# Patient Record
Sex: Male | Born: 1970 | Race: White | Hispanic: No | Marital: Single | Smoking: Former smoker
Health system: Southern US, Community
[De-identification: ages and names within clinical notes are randomized; demographics above are authoritative.]

## PROBLEM LIST (undated history)

## (undated) DIAGNOSIS — I2510 Atherosclerotic heart disease of native coronary artery without angina pectoris: Secondary | ICD-10-CM

## (undated) DIAGNOSIS — E118 Type 2 diabetes mellitus with unspecified complications: Secondary | ICD-10-CM

## (undated) DIAGNOSIS — I10 Essential (primary) hypertension: Secondary | ICD-10-CM

## (undated) DIAGNOSIS — I219 Acute myocardial infarction, unspecified: Secondary | ICD-10-CM

## (undated) DIAGNOSIS — Z9109 Other allergy status, other than to drugs and biological substances: Secondary | ICD-10-CM

## (undated) DIAGNOSIS — R7303 Prediabetes: Secondary | ICD-10-CM

## (undated) DIAGNOSIS — E785 Hyperlipidemia, unspecified: Secondary | ICD-10-CM

## (undated) HISTORY — DX: Essential (primary) hypertension: I10

## (undated) HISTORY — DX: Atherosclerotic heart disease of native coronary artery without angina pectoris: I25.10

## (undated) HISTORY — PX: KNEE ARTHROSCOPY W/ MENISCECTOMY: SHX1879

## (undated) HISTORY — DX: Other allergy status, other than to drugs and biological substances: Z91.09

## (undated) HISTORY — DX: Acute myocardial infarction, unspecified: I21.9

## (undated) HISTORY — DX: Prediabetes: R73.03

## (undated) HISTORY — DX: Type 2 diabetes mellitus with unspecified complications: E11.8

## (undated) HISTORY — DX: Hyperlipidemia, unspecified: E78.5

---

## 2000-06-21 ENCOUNTER — Emergency Department (HOSPITAL_COMMUNITY): Admission: EM | Admit: 2000-06-21 | Discharge: 2000-06-22 | Payer: Self-pay | Admitting: Emergency Medicine

## 2000-06-22 ENCOUNTER — Encounter: Payer: Self-pay | Admitting: Emergency Medicine

## 2008-06-24 ENCOUNTER — Emergency Department (HOSPITAL_COMMUNITY): Admission: EM | Admit: 2008-06-24 | Discharge: 2008-06-24 | Payer: Self-pay | Admitting: Emergency Medicine

## 2013-12-01 DIAGNOSIS — I214 Non-ST elevation (NSTEMI) myocardial infarction: Secondary | ICD-10-CM | POA: Insufficient documentation

## 2013-12-03 ENCOUNTER — Encounter (HOSPITAL_COMMUNITY): Payer: Self-pay | Admitting: Emergency Medicine

## 2013-12-03 ENCOUNTER — Inpatient Hospital Stay (HOSPITAL_COMMUNITY)
Admission: EM | Admit: 2013-12-03 | Discharge: 2013-12-07 | DRG: 247 | Disposition: A | Discharge status: Home / Self Care | Payer: 59 | Attending: Cardiology | Admitting: Cardiology

## 2013-12-03 ENCOUNTER — Emergency Department (HOSPITAL_COMMUNITY): Payer: 59

## 2013-12-03 DIAGNOSIS — Z6839 Body mass index (BMI) 39.0-39.9, adult: Secondary | ICD-10-CM

## 2013-12-03 DIAGNOSIS — E8881 Metabolic syndrome: Secondary | ICD-10-CM | POA: Diagnosis present

## 2013-12-03 DIAGNOSIS — E781 Pure hyperglyceridemia: Secondary | ICD-10-CM | POA: Diagnosis present

## 2013-12-03 DIAGNOSIS — F17200 Nicotine dependence, unspecified, uncomplicated: Secondary | ICD-10-CM | POA: Diagnosis present

## 2013-12-03 DIAGNOSIS — I2510 Atherosclerotic heart disease of native coronary artery without angina pectoris: Secondary | ICD-10-CM | POA: Diagnosis present

## 2013-12-03 DIAGNOSIS — I472 Ventricular tachycardia, unspecified: Secondary | ICD-10-CM | POA: Diagnosis present

## 2013-12-03 DIAGNOSIS — Z23 Encounter for immunization: Secondary | ICD-10-CM

## 2013-12-03 DIAGNOSIS — K219 Gastro-esophageal reflux disease without esophagitis: Secondary | ICD-10-CM | POA: Diagnosis present

## 2013-12-03 DIAGNOSIS — Z6841 Body Mass Index (BMI) 40.0 and over, adult: Secondary | ICD-10-CM

## 2013-12-03 DIAGNOSIS — I4729 Other ventricular tachycardia: Secondary | ICD-10-CM | POA: Diagnosis present

## 2013-12-03 DIAGNOSIS — I214 Non-ST elevation (NSTEMI) myocardial infarction: Principal | ICD-10-CM

## 2013-12-03 LAB — CBC WITH DIFFERENTIAL/PLATELET
Basophils Absolute: 0.1 10*3/uL (ref 0.0–0.1)
Basophils Relative: 0 % (ref 0–1)
Eosinophils Absolute: 0.2 10*3/uL (ref 0.0–0.7)
Eosinophils Relative: 1 % (ref 0–5)
HCT: 48.6 % (ref 39.0–52.0)
Hemoglobin: 16.2 g/dL (ref 13.0–17.0)
Lymphocytes Relative: 26 % (ref 12–46)
Lymphs Abs: 3.6 10*3/uL (ref 0.7–4.0)
MCH: 31.5 pg (ref 26.0–34.0)
MCHC: 33.3 g/dL (ref 30.0–36.0)
MCV: 94.6 fL (ref 78.0–100.0)
Monocytes Absolute: 0.6 10*3/uL (ref 0.1–1.0)
Monocytes Relative: 4 % (ref 3–12)
Neutro Abs: 9.3 10*3/uL — ABNORMAL HIGH (ref 1.7–7.7)
Neutrophils Relative %: 69 % (ref 43–77)
Platelets: 202 10*3/uL (ref 150–400)
RBC: 5.14 MIL/uL (ref 4.22–5.81)
RDW: 13.4 % (ref 11.5–15.5)
WBC: 13.8 10*3/uL — ABNORMAL HIGH (ref 4.0–10.5)

## 2013-12-03 LAB — COMPREHENSIVE METABOLIC PANEL
ALT: 15 U/L (ref 0–53)
AST: 17 U/L (ref 0–37)
Albumin: 3.7 g/dL (ref 3.5–5.2)
Alkaline Phosphatase: 76 U/L (ref 39–117)
Anion gap: 14 (ref 5–15)
BUN: 14 mg/dL (ref 6–23)
CO2: 24 mEq/L (ref 19–32)
Calcium: 8.7 mg/dL (ref 8.4–10.5)
Chloride: 104 mEq/L (ref 96–112)
Creatinine, Ser: 1.02 mg/dL (ref 0.50–1.35)
GFR calc Af Amer: >90 mL/min (ref >90)
GFR calc non Af Amer: 88 mL/min — ABNORMAL LOW (ref >90)
Glucose, Bld: 114 mg/dL — ABNORMAL HIGH (ref 70–99)
Potassium: 4.7 mEq/L (ref 3.7–5.3)
Sodium: 142 mEq/L (ref 137–147)
Total Bilirubin: 0.2 mg/dL — ABNORMAL LOW (ref 0.3–1.2)
Total Protein: 6.1 g/dL (ref 6.0–8.3)

## 2013-12-03 LAB — TROPONIN I: Troponin I: 0.36 ng/mL (ref <0.30)

## 2013-12-03 LAB — LIPASE, BLOOD: LIPASE: 28 U/L (ref 11–59)

## 2013-12-03 MED ORDER — SODIUM CHLORIDE 0.9 % IV BOLUS (SEPSIS)
1000.0000 mL | Freq: Once | INTRAVENOUS | Status: AC
  Administered 2013-12-03: 1000 mL via INTRAVENOUS

## 2013-12-03 NOTE — ED Notes (Signed)
The pt has been out in the heat most of the day staining a deck.  For 45 minutes he has been having some mid-chest pain no son no nausea

## 2013-12-03 NOTE — ED Provider Notes (Signed)
CSN: [id number]     Arrival date & time 12/03/13  1821 History   First MD Initiated Contact with Patient 12/03/13 2057     Chief Complaint  Patient presents with  . Chest Pain     (Consider location/radiation/quality/duration/timing/severity/associated sxs/prior Treatment) HPI Patient presents to the emergency department with mid sternal chest pressure that started today around 4:30 and lasted about an hour and a half.  The patient, states, that he had 5 minutes of intense pressure in the pressure would go down in intensity within it back up in intensity and lasted for about 5 minutes.  This pattern repeated itself until his pain resolved.  Patient, states, that he did not have any shortness of breath, nausea, vomiting, diaphoresis, weakness, dizziness, headache, blurred vision, back pain, neck pain, fever, cough, rash or syncope.  The patient, states, that he took an aspirin prior to arrival.  Patient, states, that nothing seems make his condition, better or worse.  The patient did say that his head.  Some mild bouts of chest pressure over the last 2 weeks History reviewed. No pertinent past medical history. History reviewed. No pertinent past surgical history. No family history on file. History  Substance Use Topics  . Smoking status: Current Every Day Smoker  . Smokeless tobacco: Not on file  . Alcohol Use: Yes    Review of Systems  All other systems negative except as documented in the HPI. All pertinent positives and negatives as reviewed in the HPI.  Allergies  Review of patient's allergies indicates no known allergies.  Home Medications   Prior to Admission medications   Medication Sig Start Date End Date Taking? Authorizing Provider  zolpidem (AMBIEN) 10 MG tablet Take 10 mg by mouth at bedtime as needed for sleep.   Yes Historical Provider, MD   BP 130/78  Pulse 67  Temp(Src) 97.8 F (36.6 C) (Oral)  Resp 16  SpO2 97% Physical Exam  Nursing note and vitals  reviewed. Constitutional: He is oriented to person, place, and time. He appears well-developed and well-nourished. No distress.  HENT:  Head: Normocephalic and atraumatic.  Mouth/Throat: Oropharynx is clear and moist.  Eyes: Pupils are equal, round, and reactive to light.  Neck: Normal range of motion. Neck supple.  Cardiovascular: Normal rate, regular rhythm and normal heart sounds.  Exam reveals no gallop and no friction rub.   No murmur heard. Pulmonary/Chest: Effort normal and breath sounds normal. No respiratory distress.  Neurological: He is alert and oriented to person, place, and time. He exhibits normal muscle tone. Coordination normal.  Skin: Skin is warm and dry. No erythema.    ED Course  Procedures (including critical care time) Labs Review Labs Reviewed  CBC WITH DIFFERENTIAL - Abnormal; Notable for the following:    WBC 13.8 (*)    Neutro Abs 9.3 (*)    All other components within normal limits  COMPREHENSIVE METABOLIC PANEL - Abnormal; Notable for the following:    Glucose, Bld 114 (*)    Total Bilirubin 0.2 (*)    GFR calc non Af Amer 88 (*)    All other components within normal limits  LIPASE, BLOOD  TROPONIN I  URINALYSIS, ROUTINE W REFLEX MICROSCOPIC  TROPONIN I    Imaging Review Dg Chest 2 View  12/03/2013   CLINICAL DATA:  Mid chest pain, intermittent for the past several days, worse today. Current smoker.  EXAM: CHEST  2 VIEW  COMPARISON:  None.  FINDINGS: Cardiomediastinal silhouette unremarkable. Lungs clear.  Bronchovascular markings normal. Pulmonary vascularity normal. No visible pleural effusions. No pneumothorax. Mild degenerative changes involving the thoracic spine.  IMPRESSION: No acute cardiopulmonary disease.   Electronically Signed   By: Hulan Saas M.D.   On: 12/03/2013 19:10      Date: 12/03/2013  Rate: 77  Rhythm: normal sinus rhythm  QRS Axis: normal  Intervals: normal  ST/T Wave abnormalities: normal  Conduction  Disutrbances:none  Narrative Interpretation:   Old EKG Reviewed: none available   Date: 12/03/2013 23:43  Rate: 69  Rhythm: normal sinus rhythm  QRS Axis: normal  Intervals: normal  ST/T Wave abnormalities: normal  Conduction Disutrbances:none  Narrative Interpretation:   Old EKG Reviewed: unchanged  Advised the patient, that he'll need admission to the hospital.  He is very reluctant to be admitted.  I explained to him the significant seriousness of this condition.  He agrees to be admitted.  I spoke with cardiology Dr. Nadara Eaton, who will admit the patient to the hospital for further evaluation.  Patient is currently pain-free senna nitroglycerin started.  We did start heparin   CRITICAL CARE Performed by: Carlyle Dolly Total critical care time: 35 mins Critical care time was exclusive of separately billable procedures and treating other patients. Critical care was necessary to treat or prevent imminent or life-threatening deterioration. Critical care was time spent personally by me on the following activities: development of treatment plan with patient and/or surrogate as well as nursing, discussions with consultants, evaluation of patient's response to treatment, examination of patient, obtaining history from patient or surrogate, ordering and performing treatments and interventions, ordering and review of laboratory studies, ordering and review of radiographic studies, pulse oximetry and re-evaluation of patient's condition.     Carlyle Dolly, PA-C 12/04/13 [phone]

## 2013-12-03 NOTE — ED Notes (Signed)
Pt instructed to use call bell if worsening chest pain or needs.

## 2013-12-03 NOTE — ED Notes (Signed)
Pt unable to void 

## 2013-12-03 NOTE — ED Notes (Signed)
Pt reports he still cannot provide sample. Thinks he is dehydrated. MD aware.

## 2013-12-04 ENCOUNTER — Encounter (HOSPITAL_COMMUNITY): Payer: Self-pay | Admitting: Cardiology

## 2013-12-04 DIAGNOSIS — I214 Non-ST elevation (NSTEMI) myocardial infarction: Secondary | ICD-10-CM | POA: Diagnosis present

## 2013-12-04 LAB — PROTIME-INR
INR: 1.04 (ref 0.00–1.49)
PROTHROMBIN TIME: 13.6 s (ref 11.6–15.2)

## 2013-12-04 LAB — URINALYSIS, ROUTINE W REFLEX MICROSCOPIC
BILIRUBIN URINE: NEGATIVE
Glucose, UA: NEGATIVE mg/dL
HGB URINE DIPSTICK: NEGATIVE
Ketones, ur: NEGATIVE mg/dL
Leukocytes, UA: NEGATIVE
NITRITE: NEGATIVE
PROTEIN: NEGATIVE mg/dL
SPECIFIC GRAVITY, URINE: 1.029 (ref 1.005–1.030)
UROBILINOGEN UA: 0.2 mg/dL (ref 0.0–1.0)
pH: 6 (ref 5.0–8.0)

## 2013-12-04 LAB — LIPID PANEL
Cholesterol: 138 mg/dL (ref 0–200)
HDL: 31 mg/dL — ABNORMAL LOW (ref >39)
LDL Cholesterol: 71 mg/dL (ref 0–99)
Total CHOL/HDL Ratio: 4.5 RATIO
Triglycerides: 180 mg/dL — ABNORMAL HIGH (ref <150)
VLDL: 36 mg/dL (ref 0–40)

## 2013-12-04 LAB — CBC
HEMATOCRIT: 41.5 % (ref 39.0–52.0)
Hemoglobin: 13.7 g/dL (ref 13.0–17.0)
MCH: 30.9 pg (ref 26.0–34.0)
MCHC: 33 g/dL (ref 30.0–36.0)
MCV: 93.5 fL (ref 78.0–100.0)
Platelets: 194 10*3/uL (ref 150–400)
RBC: 4.44 MIL/uL (ref 4.22–5.81)
RDW: 13.7 % (ref 11.5–15.5)
WBC: 13.9 10*3/uL — ABNORMAL HIGH (ref 4.0–10.5)

## 2013-12-04 LAB — HEPARIN LEVEL (UNFRACTIONATED)
HEPARIN UNFRACTIONATED: 0.44 [IU]/mL (ref 0.30–0.70)
Heparin Unfractionated: 0.32 IU/mL (ref 0.30–0.70)
Heparin Unfractionated: 0.19 IU/mL — ABNORMAL LOW (ref 0.30–0.70)

## 2013-12-04 LAB — TROPONIN I
Troponin I: 0.91 ng/mL (ref <0.30)
Troponin I: 2.45 ng/mL (ref <0.30)
Troponin I: 1.65 ng/mL (ref <0.30)
Troponin I: 1.36 ng/mL (ref <0.30)

## 2013-12-04 LAB — HEMOGLOBIN A1C
Hgb A1c MFr Bld: 5.7 % — ABNORMAL HIGH (ref <5.7)
Mean Plasma Glucose: 117 mg/dL — ABNORMAL HIGH (ref <117)

## 2013-12-04 MED ORDER — HEPARIN (PORCINE) IN NACL 100-0.45 UNIT/ML-% IJ SOLN
1650.0000 [IU]/h | INTRAMUSCULAR | Status: DC
  Administered 2013-12-04: 1400 [IU]/h via INTRAVENOUS
  Administered 2013-12-04 – 2013-12-05 (×2): 1700 [IU]/h via INTRAVENOUS
  Administered 2013-12-05: 1650 [IU]/h via INTRAVENOUS
  Filled 2013-12-04 (×6): qty 250

## 2013-12-04 MED ORDER — METOPROLOL TARTRATE 25 MG PO TABS
25.0000 mg | ORAL_TABLET | Freq: Two times a day (BID) | ORAL | Status: DC
  Administered 2013-12-04 – 2013-12-07 (×7): 25 mg via ORAL
  Filled 2013-12-04 (×9): qty 1

## 2013-12-04 MED ORDER — NITROGLYCERIN 2 % TD OINT
1.0000 [in_us] | TOPICAL_OINTMENT | Freq: Four times a day (QID) | TRANSDERMAL | Status: DC
  Administered 2013-12-04 – 2013-12-06 (×10): 1 [in_us] via TOPICAL
  Filled 2013-12-04: qty 30

## 2013-12-04 MED ORDER — PNEUMOCOCCAL VAC POLYVALENT 25 MCG/0.5ML IJ INJ
0.5000 mL | INJECTION | INTRAMUSCULAR | Status: DC
  Filled 2013-12-04: qty 0.5

## 2013-12-04 MED ORDER — HEPARIN BOLUS VIA INFUSION
3000.0000 [IU] | Freq: Once | INTRAVENOUS | Status: AC
  Administered 2013-12-04: 3000 [IU] via INTRAVENOUS
  Filled 2013-12-04: qty 3000

## 2013-12-04 MED ORDER — HEPARIN BOLUS VIA INFUSION
4000.0000 [IU] | Freq: Once | INTRAVENOUS | Status: AC
  Administered 2013-12-04: 4000 [IU] via INTRAVENOUS
  Filled 2013-12-04: qty 4000

## 2013-12-04 MED ORDER — ATORVASTATIN CALCIUM 80 MG PO TABS
80.0000 mg | ORAL_TABLET | Freq: Every day | ORAL | Status: DC
  Administered 2013-12-04 – 2013-12-06 (×3): 80 mg via ORAL
  Filled 2013-12-04 (×4): qty 1

## 2013-12-04 MED ORDER — ONDANSETRON HCL 4 MG/2ML IJ SOLN: 4.0000 mg | Freq: Three times a day (TID) | INTRAMUSCULAR | Status: AC | PRN

## 2013-12-04 MED ORDER — ASPIRIN 81 MG PO CHEW
324.0000 mg | CHEWABLE_TABLET | Freq: Once | ORAL | Status: AC
  Administered 2013-12-04: 324 mg via ORAL
  Filled 2013-12-04: qty 4

## 2013-12-04 MED ORDER — ASPIRIN 81 MG PO CHEW
81.0000 mg | CHEWABLE_TABLET | Freq: Every day | ORAL | Status: DC
  Administered 2013-12-04 – 2013-12-07 (×4): 81 mg via ORAL
  Filled 2013-12-04 (×4): qty 1

## 2013-12-04 MED ORDER — SODIUM CHLORIDE 0.9 % IJ SOLN
3.0000 mL | Freq: Two times a day (BID) | INTRAMUSCULAR | Status: DC
  Administered 2013-12-05: 3 mL via INTRAVENOUS

## 2013-12-04 MED ORDER — SODIUM CHLORIDE 0.9 % IV SOLN
INTRAVENOUS | Status: DC
  Administered 2013-12-06: 04:00:00 via INTRAVENOUS

## 2013-12-04 MED ORDER — SODIUM CHLORIDE 0.9 % IV SOLN: 250.0000 mL | INTRAVENOUS | Status: DC | PRN

## 2013-12-04 MED ORDER — SODIUM CHLORIDE 0.9 % IJ SOLN
3.0000 mL | INTRAMUSCULAR | Status: DC | PRN
  Administered 2013-12-05: 3 mL via INTRAVENOUS

## 2013-12-04 NOTE — ED Notes (Signed)
CRITICAL VALUE ALERT  Date of notification:  12/04/13  Time of notification:  0005  Critical value read back:Yes.    Nurse who received alert:  Armen Pickup RN  PA-C notified:  Ebbie Ridge PA-C

## 2013-12-04 NOTE — ED Notes (Signed)
Pt still unable to void

## 2013-12-04 NOTE — H&P (Signed)
Manuel Carey is an 43 y.o. male.   Chief Complaint: Chest pain HPI: A shunt is a 19 Caucasian male with no significant prior cardiovascular history, history of tobacco use disorder, morbid obesity who presented with chest pain that started about 1 week to 2 weeks ago. It was exertional in the beginning, he would belch and would feel better. It is located in the middle of the chest, lasting few minutes. He thought that this is related to GERD. Yesterday he worked on his deck and did heavy exertional activity, this was followed by severe chest tightness, associated with diaphoresis, he would then presented to the emergency room. On presentation, he was found to have positive cardiac markers for myocardial injury. I was requested to admit the patient for further evaluation. Patient was chest pain-free on presentation to emergency room after he was started on IV heparin and was given sublingual nitroglycerin.  This morning patient remains asymptomatic. Except for chronic shortness of breath which he attributes to smoking, no the complaints. He sees Dr. Reece Levy is his PCP.  History reviewed. No pertinent past medical history.  History reviewed. No pertinent past surgical history.  No family history  coronary artery disease or diabetes mellitus.  Social History:  reports that he has been smoking Cigarettes.  He has a 25 pack-year smoking history. He does not have any smokeless tobacco history on file. He reports that he drinks alcohol. His drug history is not on file.  Allergies: No Known Allergies  Medications Prior to Admission  Medication Sig Dispense Refill  . zolpidem (AMBIEN) 10 MG tablet Take 10 mg by mouth at bedtime as needed for sleep.          Review of Systems - has GERD, loud snoring at night, denies symptoms of sleep apnea, no symptoms of TIA or claudication, no recent weight changes, no palpation. No heat or cold intolerance. Other systems negative.   Blood pressure  109/67, pulse 66, temperature 98.5 F (36.9 C), temperature source Oral, resp. rate 16, height '5\' 9"'  (1.753 m), weight 122.925 kg (271 lb), SpO2 97.00%. General appearance: alert, cooperative, appears stated age and morbidly obese Eyes: negative findings: conjunctivae and sclerae normal Neck: no adenopathy, no carotid bruit, no JVD, supple, symmetrical, trachea midline and thyroid not enlarged, symmetric, no tenderness/mass/nodules Neck: JVP - normal, carotids 2+= without bruits Resp: rales bilaterally and Diffuse Chest wall: no tenderness Cardio: regular rate and rhythm, S1, S2 normal, no murmur, click, rub or gallop GI: soft, non-tender; bowel sounds normal; no masses,  no organomegaly and Pannus present Extremities: extremities normal, atraumatic, no cyanosis or edema Pulses: 2+ and symmetric Skin: Skin color, texture, turgor normal. No rashes or lesions Neurologic: Grossly normal  Results for orders placed during the hospital encounter of 12/03/13 (from the past 48 hour(s))  CBC WITH DIFFERENTIAL     Status: Abnormal   Collection Time    12/03/13  6:36 PM      Result Value Ref Range   WBC 13.8 (*) 4.0 - 10.5 K/uL   RBC 5.14  4.22 - 5.81 MIL/uL   Hemoglobin 16.2  13.0 - 17.0 g/dL   HCT 48.6  39.0 - 52.0 %   MCV 94.6  78.0 - 100.0 fL   MCH 31.5  26.0 - 34.0 pg   MCHC 33.3  30.0 - 36.0 g/dL   RDW 13.4  11.5 - 15.5 %   Platelets 202  150 - 400 K/uL   Neutrophils Relative % 69  43 - 77 %   Neutro Abs 9.3 (*) 1.7 - 7.7 K/uL   Lymphocytes Relative 26  12 - 46 %   Lymphs Abs 3.6  0.7 - 4.0 K/uL   Monocytes Relative 4  3 - 12 %   Monocytes Absolute 0.6  0.1 - 1.0 K/uL   Eosinophils Relative 1  0 - 5 %   Eosinophils Absolute 0.2  0.0 - 0.7 K/uL   Basophils Relative 0  0 - 1 %   Basophils Absolute 0.1  0.0 - 0.1 K/uL  COMPREHENSIVE METABOLIC PANEL     Status: Abnormal   Collection Time    12/03/13  6:36 PM      Result Value Ref Range   Sodium 142  137 - 147 mEq/L   Potassium 4.7   3.7 - 5.3 mEq/L   Chloride 104  96 - 112 mEq/L   CO2 24  19 - 32 mEq/L   Glucose, Bld 114 (*) 70 - 99 mg/dL   BUN 14  6 - 23 mg/dL   Creatinine, Ser 1.02  0.50 - 1.35 mg/dL   Calcium 8.7  8.4 - 10.5 mg/dL   Total Protein 6.1  6.0 - 8.3 g/dL   Albumin 3.7  3.5 - 5.2 g/dL   AST 17  0 - 37 U/L   Comment: HEMOLYSIS AT THIS LEVEL MAY AFFECT RESULT   ALT 15  0 - 53 U/L   Alkaline Phosphatase 76  39 - 117 U/L   Total Bilirubin 0.2 (*) 0.3 - 1.2 mg/dL   GFR calc non Af Amer 88 (*) >90 mL/min   GFR calc Af Amer >90  >90 mL/min   Comment: (NOTE)     The eGFR has been calculated using the CKD EPI equation.     This calculation has not been validated in all clinical situations.     eGFR's persistently <90 mL/min signify possible Chronic Kidney     Disease.   Anion gap 14  5 - 15  LIPASE, BLOOD     Status: None   Collection Time    12/03/13  6:36 PM      Result Value Ref Range   Lipase 28  11 - 59 U/L  TROPONIN I     Status: None   Collection Time    12/03/13  6:37 PM      Result Value Ref Range   Troponin I <0.30  <0.30 ng/mL   Comment:            Due to the release kinetics of cTnI,     a negative result within the first hours     of the onset of symptoms does not rule out     myocardial infarction with certainty.     If myocardial infarction is still suspected,     repeat the test at appropriate intervals.  TROPONIN I     Status: Abnormal   Collection Time    12/03/13 10:55 PM      Result Value Ref Range   Troponin I 0.36 (*) <0.30 ng/mL   Comment:            Due to the release kinetics of cTnI,     a negative result within the first hours     of the onset of symptoms does not rule out     myocardial infarction with certainty.     If myocardial infarction is still suspected,     repeat the test at  appropriate intervals.     CRITICAL RESULT CALLED TO, READ BACK BY AND VERIFIED WITH:     E.BREWER RN 0001 12/04/13 E.GADDY  TROPONIN I     Status: Abnormal   Collection Time     12/04/13 12:48 AM      Result Value Ref Range   Troponin I 0.91 (*) <0.30 ng/mL   Comment:            Due to the release kinetics of cTnI,     a negative result within the first hours     of the onset of symptoms does not rule out     myocardial infarction with certainty.     If myocardial infarction is still suspected,     repeat the test at appropriate intervals.     CRITICAL VALUE NOTED.  VALUE IS CONSISTENT WITH PREVIOUSLY REPORTED AND CALLED VALUE.  URINALYSIS, ROUTINE W REFLEX MICROSCOPIC     Status: None   Collection Time    12/04/13  1:27 AM      Result Value Ref Range   Color, Urine YELLOW  YELLOW   APPearance CLEAR  CLEAR   Specific Gravity, Urine 1.029  1.005 - 1.030   pH 6.0  5.0 - 8.0   Glucose, UA NEGATIVE  NEGATIVE mg/dL   Hgb urine dipstick NEGATIVE  NEGATIVE   Bilirubin Urine NEGATIVE  NEGATIVE   Ketones, ur NEGATIVE  NEGATIVE mg/dL   Protein, ur NEGATIVE  NEGATIVE mg/dL   Urobilinogen, UA 0.2  0.0 - 1.0 mg/dL   Nitrite NEGATIVE  NEGATIVE   Leukocytes, UA NEGATIVE  NEGATIVE   Comment: MICROSCOPIC NOT DONE ON URINES WITH NEGATIVE PROTEIN, BLOOD, LEUKOCYTES, NITRITE, OR GLUCOSE <1000 mg/dL.  CBC     Status: Abnormal   Collection Time    12/04/13  3:10 AM      Result Value Ref Range   WBC 13.9 (*) 4.0 - 10.5 K/uL   RBC 4.44  4.22 - 5.81 MIL/uL   Hemoglobin 13.7  13.0 - 17.0 g/dL   HCT 41.5  39.0 - 52.0 %   MCV 93.5  78.0 - 100.0 fL   MCH 30.9  26.0 - 34.0 pg   MCHC 33.0  30.0 - 36.0 g/dL   RDW 13.7  11.5 - 15.5 %   Platelets 194  150 - 400 K/uL  LIPID PANEL     Status: Abnormal   Collection Time    12/04/13  3:10 AM      Result Value Ref Range   Cholesterol 138  0 - 200 mg/dL   Triglycerides 180 (*) <150 mg/dL   HDL 31 (*) >39 mg/dL   Total CHOL/HDL Ratio 4.5     VLDL 36  0 - 40 mg/dL   LDL Cholesterol 71  0 - 99 mg/dL   Comment:            Total Cholesterol/HDL:CHD Risk     Coronary Heart Disease Risk Table                         Men   Women       1/2 Average Risk   3.4   3.3      Average Risk       5.0   4.4      2 X Average Risk   9.6   7.1      3 X Average Risk  23.4   11.0  Use the calculated Patient Ratio     above and the CHD Risk Table     to determine the patient's CHD Risk.                ATP III CLASSIFICATION (LDL):      <100     mg/dL   Optimal      100-129  mg/dL   Near or Above                        Optimal      130-159  mg/dL   Borderline      160-189  mg/dL   High      >190     mg/dL   Very High  TROPONIN I     Status: Abnormal   Collection Time    12/04/13  6:45 AM      Result Value Ref Range   Troponin I 2.45 (*) <0.30 ng/mL   Comment:            Due to the release kinetics of cTnI,     a negative result within the first hours     of the onset of symptoms does not rule out     myocardial infarction with certainty.     If myocardial infarction is still suspected,     repeat the test at appropriate intervals.     CRITICAL VALUE NOTED.  VALUE IS CONSISTENT WITH PREVIOUSLY REPORTED AND CALLED VALUE.   Dg Chest 2 View  12/03/2013   CLINICAL DATA:  Mid chest pain, intermittent for the past several days, worse today. Current smoker.  EXAM: CHEST  2 VIEW  COMPARISON:  None.  FINDINGS: Cardiomediastinal silhouette unremarkable. Lungs clear. Bronchovascular markings normal. Pulmonary vascularity normal. No visible pleural effusions. No pneumothorax. Mild degenerative changes involving the thoracic spine.  IMPRESSION: No acute cardiopulmonary disease.   Electronically Signed   By: Evangeline Dakin M.D.   On: 12/03/2013 19:10    Labs:   Lab Results  Component Value Date   WBC 13.9* 12/04/2013   HGB 13.7 12/04/2013   HCT 41.5 12/04/2013   MCV 93.5 12/04/2013   PLT 194 12/04/2013    Recent Labs Lab 12/03/13 1836  NA 142  K 4.7  CL 104  CO2 24  BUN 14  CREATININE 1.02  CALCIUM 8.7  PROT 6.1  BILITOT 0.2*  ALKPHOS 76  ALT 15  AST 17  GLUCOSE 114*   Lab Results  Component Value Date    TROPONINI 2.45* 12/04/2013    Lipid Panel     Component Value Date/Time   CHOL 138 12/04/2013 0310   TRIG 180* 12/04/2013 0310   HDL 31* 12/04/2013 0310   CHOLHDL 4.5 12/04/2013 0310   VLDL 36 12/04/2013 0310   LDLCALC 71 12/04/2013 0310    EKG: normal EKG, normal sinus rhythm, no evidence of ischemia.   Assessment/Plan 1. Non-ST elevation myocardial infarction 2. Mild hypertriglyceridemia, suspect metabolic syndrome, hemoglobin A1c pending. 3. Hyperglycemia 4. Morbid obesity 5. Tobacco use disorder  Recommendation: I discussed extensively with the patient regarding his cardiovascular risks. Patient appears to be motivated and smoking cessation. He is presently asymptomatic, will continue to monitor him closely, unless recurrence of chest pain, proceed with coronary angiography on Monday morning this being the weekend. I have explained to them regarding risk, benefits, alternatives to coronary angiography. Risks including but not limited to less than 1% risk of that, stroke, MI,  need for urgent CABG, bleeding, infection was discussed the patient.   Laverda Page, MD 12/04/2013, 8:05 AM [facility]. [place] Pager: [phone] Office: [phone] If no answer: Cell:  [phone]

## 2013-12-04 NOTE — Progress Notes (Signed)
Pt came up to the floor and CCMD called saying pt had 30 beats of wide QRS. Pt denied any CP or SOB. Md on call made aware. New orders received. Will cont to monitor pt.

## 2013-12-04 NOTE — Progress Notes (Signed)
ANTICOAGULATION CONSULT NOTE - Initial Consult  Pharmacy Consult for heparin Indication: chest pain/ACS  No Known Allergies  Patient Measurements: Height: 5\' 8"  (172.7 cm) Weight: 280 lb (127.007 kg) IBW/kg (Calculated) : 68.4 Heparin Dosing Weight: 100kg  Vital Signs: Temp: 97.8 F (36.6 C) (07/17 1829) Temp src: Oral (07/17 1829) BP: 124/76 mmHg (07/18 0021) Pulse Rate: 81 (07/18 0021)  Labs:  Recent Labs  12/03/13 1836 12/03/13 1837 12/03/13 2255  HGB 16.2  --   --   HCT 48.6  --   --   PLT 202  --   --   CREATININE 1.02  --   --   TROPONINI  --  <0.30 0.36*    Estimated Creatinine Clearance: 120 ml/min (by C-G formula based on Cr of 1.02).   Medical History: History reviewed. No pertinent past medical history.    Assessment: 43yo male had been outside most of the day and now c/o mid-CP, denies SOB/N/V, initial troponin negative but now increasing, to begin heparin.  Goal of Therapy:  Heparin level 0.3-0.7 units/ml Monitor platelets by anticoagulation protocol: Yes   Plan:  Will give heparin 4000 units x1 followed by gtt at 1400 units/hr and monitor heparin levels and CBC.  Vernard Gambles, PharmD, BCPS  12/04/2013,12:32 AM

## 2013-12-04 NOTE — ED Provider Notes (Signed)
I Face-to-face evaluation   History: Intermittant CP worsened today with more persistant CP. SX for 2 weeks. No associated sx or prior sx  Physical exam: Obese, alert. Lungs with scattered rhonchi. Heart RRR, no murmur.            EKG Interpretation  Date/Time:  Friday December 03 2013 23:43:16 EDT Ventricular Rate:  69 PR Interval:  160 QRS Duration: 81 QT Interval:  416 QTC Calculation: 446 R Axis:   -7 Text Interpretation:  Sinus rhythm Since last tracing of earlier today No significant change was found Confirmed by Effie Shy  MD, Mechele Collin ([id number]) on 12/04/2013 12:39:28 AM       Plan: Admit for NSTEMI   Medical screening examination/treatment/procedure(s) were conducted as a shared visit with non-physician practitioner(s) and myself.  I personally evaluated the patient during the encounter  Flint Melter, MD 12/04/13 [phone]

## 2013-12-04 NOTE — Progress Notes (Addendum)
ANTICOAGULATION CONSULT NOTE - Follow-up  Pharmacy Consult for heparin Indication: chest pain/ACS  No Known Allergies  Patient Measurements: Height: 5\' 9"  (175.3 cm) Weight: 271 lb (122.925 kg) IBW/kg (Calculated) : 70.7 Heparin Dosing Weight: 100kg  Vital Signs: Temp: 98.5 F (36.9 C) (07/18 0500) BP: 109/67 mmHg (07/18 0500) Pulse Rate: 66 (07/18 0500)  Labs:  Recent Labs  12/03/13 1836  12/03/13 2255 12/04/13 0048 12/04/13 0310 12/04/13 0645  HGB 16.2  --   --   --  13.7  --   HCT 48.6  --   --   --  41.5  --   PLT 202  --   --   --  194  --   CREATININE 1.02  --   --   --   --   --   TROPONINI  --   < > 0.36* 0.91*  --  2.45*  < > = values in this interval not displayed.  Estimated Creatinine Clearance: 119.7 ml/min (by C-G formula based on Cr of 1.02).  Assessment: 43yo male continues on IV heparin gtt for CP. Initial heparin level is therapeutic at 0.32. Result was not reported via Epic but lab verified that they had the result. H/H and plts are WNL, no bleeding noted.   Goal of Therapy:  Heparin level 0.3-0.7 units/ml Monitor platelets by anticoagulation protocol: Yes   Plan:  1. Continue heparin gtt 1400 units/hr 2. Check a 6 hour heparin level to confirm 3. Continue daily heparin level and CBC  Lysle Pearl, PharmD, BCPS Pager # [phone] 12/04/2013 9:05 AM  Addendum: Recheck heparin level is low at 0.19. Initial level likely reflective of bolus of heparin.   Plan: 1. Heparin bolus 3000 units IV x 1 2. Increase heparin gtt to 1700 units/hr 3. Check a 6 hour heparin level 4. Continue daily heparin level and CBC  Lysle Pearl, PharmD, BCPS Pager # [phone] 12/04/2013 1:27 PM    ===========================================   Addendum: - HL 0.44, therapeutic; no bleeding reported    Plan: - Continue heparin gtt at 1700 units/hr - F/U AM labs    Makaylah Oddo D. Laney Potash, PharmD, BCPS Pager:  [phone] 12/04/2013, 8:24 PM

## 2013-12-05 LAB — CBC
HCT: 39.5 % (ref 39.0–52.0)
Hemoglobin: 13 g/dL (ref 13.0–17.0)
MCH: 31.1 pg (ref 26.0–34.0)
MCHC: 32.9 g/dL (ref 30.0–36.0)
MCV: 94.5 fL (ref 78.0–100.0)
PLATELETS: 169 10*3/uL (ref 150–400)
RBC: 4.18 MIL/uL — ABNORMAL LOW (ref 4.22–5.81)
RDW: 13.7 % (ref 11.5–15.5)
WBC: 11.7 10*3/uL — AB (ref 4.0–10.5)

## 2013-12-05 LAB — HEPARIN LEVEL (UNFRACTIONATED): Heparin Unfractionated: 0.68 IU/mL (ref 0.30–0.70)

## 2013-12-05 NOTE — Progress Notes (Signed)
Utilization Review Completed.Emmerich Cryer T7/19/2015  

## 2013-12-05 NOTE — Progress Notes (Signed)
Subjective:  No further chest pain, patient essentially asymptomatic.  Patient had 30 beat run of ventricular tachycardia yesterday morning, Holter tracings could not be saved on telemetry.  No further arrhythmias overnight.  Objective:  Vital Signs in the last 24 hours: Temp:  [97.8 F (36.6 C)-98.9 F (37.2 C)] 97.9 F (36.6 C) (07/19 0500) Pulse Rate:  [60-80] 60 (07/19 0500) Resp:  [20] 20 (07/19 0500) BP: (108-122)/(60-66) 109/60 mmHg (07/19 0500) SpO2:  [96 %-97 %] 96 % (07/19 0500)  Intake/Output from previous day: 07/18 0701 - 07/19 0700 In: 880 [P.O.:880] Out: 1 [Urine:1]  Physical Exam: General appearance: alert, cooperative, appears stated age and morbidly obese  Eyes: negative findings: conjunctivae and sclerae normal  Neck: no adenopathy, no carotid bruit, no JVD, supple, symmetrical, trachea midline and thyroid not enlarged, symmetric, no tenderness/mass/nodules  Neck: JVP - normal, carotids 2+= without bruits  Resp: rales bilaterally and Diffuse  Chest wall: no tenderness  Cardio: regular rate and rhythm, S1, S2 normal, no murmur, click, rub or gallop  GI: soft, non-tender; bowel sounds normal; no masses, no organomegaly and Pannus present  Extremities: extremities normal, atraumatic, no cyanosis or edema  Lab Results: BMP  Recent Labs  12/03/13 1836  NA 142  K 4.7  CL 104  CO2 24  GLUCOSE 114*  BUN 14  CREATININE 1.02  CALCIUM 8.7  GFRNONAA 88*  GFRAA >90    CBC  Recent Labs Lab 12/03/13 1836  12/05/13 0424  WBC 13.8*  < > 11.7*  RBC 5.14  < > 4.18*  HGB 16.2  < > 13.0  HCT 48.6  < > 39.5  PLT 202  < > 169  MCV 94.6  < > 94.5  MCH 31.5  < > 31.1  MCHC 33.3  < > 32.9  RDW 13.4  < > 13.7  LYMPHSABS 3.6  --   --   MONOABS 0.6  --   --   EOSABS 0.2  --   --   BASOSABS 0.1  --   --   < > = values in this interval not displayed.  HEMOGLOBIN A1C Lab Results  Component Value Date   HGBA1C 5.7* 12/04/2013   MPG 117* 12/04/2013     Cardiac Panel (last 3 results)  Recent Labs  12/04/13 0645 12/04/13 1231 12/04/13 1950  TROPONINI 2.45* 1.65* 1.36*    BNP (last 3 results) No results found for this basename: PROBNP,  in the last 8760 hours  TSH No results found for this basename: TSH,  in the last 8760 hours  CHOLESTEROL  Recent Labs  12/04/13 0310  CHOL 138    Hepatic Function Panel  Recent Labs  12/03/13 1836  PROT 6.1  ALBUMIN 3.7  AST 17  ALT 15  ALKPHOS 76  BILITOT 0.2*    EKG: 12/03/2013: normal EKG, normal sinus rhythm, unchanged from previous tracings.   Assessment/Plan:  1.  Non-ST elevation myocardial infarction 2.  Metabolic syndrome including hypertriglyceridemia, morbid obesity, hyperglycemia 3.  30run of NSVT on admission, not captured on telemetry due to change in the monitor from ED to floor.  No further episodes. 4.  Tobacco use disorder  Recommendation: patient scheduled for coronary angiography in the morning.  No changes in medications were done today.  Patient appears to be motivated for smoking cessation. obtain TSH.   [name], M.D. 12/05/2013, 12:54 PM [facility] Pager: [phone] Office: [phone] If no answer: [phone]

## 2013-12-05 NOTE — Progress Notes (Signed)
ANTICOAGULATION CONSULT NOTE - Follow-up Consult  Pharmacy Consult for heparin Indication: chest pain/ACS  No Known Allergies  Patient Measurements: Height: 5\' 9"  (175.3 cm) Weight: 271 lb (122.925 kg) IBW/kg (Calculated) : 70.7 Heparin Dosing Weight: 100kg  Vital Signs: Temp: 97.9 F (36.6 C) (07/19 0500) BP: 109/60 mmHg (07/19 0500) Pulse Rate: 60 (07/19 0500)  Recent Labs  12/03/13 1836  12/04/13 0310 12/04/13 0645 12/04/13 1231 12/04/13 1235 12/04/13 1718 12/04/13 1950 12/04/13 1955 12/05/13 0424  HGB 16.2  --  13.7  --   --   --   --   --   --  13.0  HCT 48.6  --  41.5  --   --   --   --   --   --  39.5  PLT 202  --  194  --   --   --   --   --   --  169  LABPROT  --   --   --   --   --   --  13.6  --   --   --   INR  --   --   --   --   --   --  1.04  --   --   --   HEPARINUNFRC  --   --   --   --   --  0.19*  --   --  0.44 0.68  CREATININE 1.02  --   --   --   --   --   --   --   --   --   TROPONINI  --   < >  --  2.45* 1.65*  --   --  1.36*  --   --   < > = values in this interval not displayed.  Estimated Creatinine Clearance: 119.7 ml/min (by C-G formula based on Cr of 1.02).  Assessment: 43yo male continues on IV heparin gtt for CP. Heparin level remains therapeutic today at 0.68 (therapeutic x3) but on the high end of the therapeutic range. 7/18 AM result was not reported via Epic but lab verified that they had the result. H/H and plts are WNL, no bleeding noted.   Goal of Therapy:  Heparin level 0.3-0.7 units/ml Monitor platelets by anticoagulation protocol: Yes  Plan: 1. Decrease heparin gtt slightly to 1650 units/hr to prevent going supratherapeutic 2. Continue daily heparin level and CBC  Waynette Buttery, PharmD Clinical Pharmacy Resident Pager: [phone] 12/05/2013 8:19 AM

## 2013-12-06 ENCOUNTER — Encounter (HOSPITAL_COMMUNITY)
Admission: EM | Disposition: A | Discharge status: Home / Self Care | Payer: 59 | Source: Home / Self Care | Attending: Cardiology

## 2013-12-06 ENCOUNTER — Other Ambulatory Visit: Payer: Self-pay

## 2013-12-06 HISTORY — PX: LEFT HEART CATHETERIZATION WITH CORONARY ANGIOGRAM: SHX5451

## 2013-12-06 HISTORY — PX: PERCUTANEOUS STENT INTERVENTION: SHX5500

## 2013-12-06 LAB — CBC
HCT: 41.2 % (ref 39.0–52.0)
Hemoglobin: 13.6 g/dL (ref 13.0–17.0)
MCH: 31.4 pg (ref 26.0–34.0)
MCHC: 33 g/dL (ref 30.0–36.0)
MCV: 95.2 fL (ref 78.0–100.0)
Platelets: 172 10*3/uL (ref 150–400)
RBC: 4.33 MIL/uL (ref 4.22–5.81)
RDW: 13.4 % (ref 11.5–15.5)
WBC: 8.6 10*3/uL (ref 4.0–10.5)

## 2013-12-06 LAB — POCT ACTIVATED CLOTTING TIME
ACTIVATED CLOTTING TIME: 242 s
Activated Clotting Time: 258 seconds

## 2013-12-06 LAB — HEPARIN LEVEL (UNFRACTIONATED): Heparin Unfractionated: 0.69 IU/mL (ref 0.30–0.70)

## 2013-12-06 SURGERY — LEFT HEART CATHETERIZATION WITH CORONARY ANGIOGRAM
Anesthesia: LOCAL

## 2013-12-06 MED ORDER — ACETAMINOPHEN 325 MG PO TABS: 650.0000 mg | ORAL_TABLET | ORAL | Status: DC | PRN

## 2013-12-06 MED ORDER — HYDROMORPHONE HCL PF 1 MG/ML IJ SOLN
INTRAMUSCULAR | Status: AC
  Filled 2013-12-06: qty 1

## 2013-12-06 MED ORDER — ONDANSETRON HCL 4 MG/2ML IJ SOLN: 4.0000 mg | Freq: Four times a day (QID) | INTRAMUSCULAR | Status: DC | PRN

## 2013-12-06 MED ORDER — NITROGLYCERIN 0.2 MG/ML ON CALL CATH LAB
INTRAVENOUS | Status: AC
  Filled 2013-12-06: qty 1

## 2013-12-06 MED ORDER — PRASUGREL HCL 10 MG PO TABS
ORAL_TABLET | ORAL | Status: AC
  Filled 2013-12-06: qty 1

## 2013-12-06 MED ORDER — VERAPAMIL HCL 2.5 MG/ML IV SOLN
INTRAVENOUS | Status: AC
  Filled 2013-12-06: qty 2

## 2013-12-06 MED ORDER — HEPARIN (PORCINE) IN NACL 2-0.9 UNIT/ML-% IJ SOLN
INTRAMUSCULAR | Status: AC
  Filled 2013-12-06: qty 1500

## 2013-12-06 MED ORDER — MIDAZOLAM HCL 2 MG/2ML IJ SOLN
INTRAMUSCULAR | Status: AC
  Filled 2013-12-06: qty 2

## 2013-12-06 MED ORDER — SODIUM CHLORIDE 0.9 % IV SOLN: 250.0000 mL | INTRAVENOUS | Status: DC | PRN

## 2013-12-06 MED ORDER — SODIUM CHLORIDE 0.9 % IV SOLN
1.0000 mL/kg/h | INTRAVENOUS | Status: AC
  Administered 2013-12-06: 1 mL/kg/h via INTRAVENOUS

## 2013-12-06 MED ORDER — SODIUM CHLORIDE 0.9 % IJ SOLN: 3.0000 mL | Freq: Two times a day (BID) | INTRAMUSCULAR | Status: DC

## 2013-12-06 MED ORDER — LIDOCAINE HCL (PF) 1 % IJ SOLN
INTRAMUSCULAR | Status: AC
  Filled 2013-12-06: qty 30

## 2013-12-06 MED ORDER — HEPARIN SODIUM (PORCINE) 1000 UNIT/ML IJ SOLN
INTRAMUSCULAR | Status: AC
  Filled 2013-12-06: qty 1

## 2013-12-06 MED ORDER — PRASUGREL HCL 10 MG PO TABS
10.0000 mg | ORAL_TABLET | Freq: Every day | ORAL | Status: DC
  Administered 2013-12-06 – 2013-12-07 (×2): 10 mg via ORAL
  Filled 2013-12-06 (×3): qty 1

## 2013-12-06 MED ORDER — SODIUM CHLORIDE 0.9 % IJ SOLN: 3.0000 mL | INTRAMUSCULAR | Status: DC | PRN

## 2013-12-06 NOTE — Interval H&P Note (Signed)
History and Physical Interval Note:  12/06/2013 7:42 AM  Manuel Carey  has presented today for surgery, with the diagnosis of chest pain  The various methods of treatment have been discussed with the patient and family. After consideration of risks, benefits and other options for treatment, the patient has consented to  Procedure(s): LEFT HEART CATHETERIZATION WITH CORONARY ANGIOGRAM (N/A) and possible PCI  as a surgical intervention .  The patient's history has been reviewed, patient examined, no change in status, stable for surgery.  I have reviewed the patient's chart and labs.  Questions were answered to the patient's satisfaction.     [name]

## 2013-12-06 NOTE — CV Procedure (Addendum)
Procedure performed:  Left heart catheterization including hemodynamic monitoring of the left ventricle, LV gram. Selective right and left coronary arteriography. PTCA and stenting of the proximal LAD with implantation of a 3.0 x 33 mm Xience Alpine DES.  Indication: Patient is a [age] year-old Caucasian male with no significant prior cardiovascular history who presented with non-ST elevation myocardial infarction. Hence is brought to the cardiac catheterization lab to evaluate his coronary anatomy.  Hemodynamic data: Left ventricular pressure was 113/2 with LVEDP of 24 mm mercury. Aortic pressure was 112/79 with a mean of 95 mm mercury. There was no pressure gradient across the aortic valve.   Left ventricle: Performed in the RAO projection revealed LVEF of 60%. There was No MR. No wall motion abnormality.  Right coronary artery: The vessel is smooth, normal, Superdominant Dominant. Gives origin to an aberant OM from the PL branch. The right coronary artery is mildly tortuous in the proximal segment, very large caliber vessel with diffuse luminal irregularity. Mild ectasia was evident.  Left main coronary artery is large and normal.  Circumflex coronary artery: A very small vessel, with mild disease   LAD:  LAD gives origin to a large diagonal-1(large ramus intermediate equal and and has secondary branches). The diagonal has mild diffuse disease. The ostium of this vessel has 20-30% stenosis.Marland Kitchen.  LAD has high-grade 90% stenosis in the proximal segment. The remaining vessel is smooth and appears normal.  Interventional data:  Single vessel coronary artery disease involving the proximal LAD. Successful PTCA and stenting of the proximal LAD with implantation of a 3.0 x 33 mm Xience Alpine DES. Will need Dual antiplatelet therapy with Effient and ASA 81 mg for at least one year.   Technique of diagnostic cardiac catheterization:  Under sterile precautions using a 6 French right radial  arterial access,  a 6 French sheath was introduced into the right radial artery. A 5 [place] Tig 4 catheter was advanced into the ascending aorta selective   left coronary artery was cannulated and angiography was performed in multiple views. The catheter was pulled back Out of the body over exchange length J-wire. Same Catheter was used to perform LV gram which was performed in RAO projection. Catheter exchanged out of the body over J-Wire. A 5 [place] JR 4 diagnostic catheter was utilized to engage the right coronary artery and angiography was performed. NO immediate complications noted.  Patient tolerated the procedure well.   Technique of intervention:  Using a 6 [place] JL 4 guide (unable to engage and cannulated the left main with multiple catheters including XB 3.5, EBU 3.5, Ikari L 4.0) catheter the left main  coronary  was selected and cannulated. Using heparin for anticoagulation, I utilized a 0.014 x 190 cm cougar XT guidewire and across the LAD coronary artery without any difficulty. I placed the tip of the wire into the distal  coronary artery. Angiography was performed. I attempted to directly stent the lesion, however because of inability to cross the stent, I utilized a 3.0 x 20 mm Austin Trek balloon and balloon angioplasty at 12 atmospheric pressure for 30 seconds was performed. This was followed by implantation of a 3.0 x 33 mm Xience Alpine DES which was deployed at 12 atmospheric pressure for 45 seconds. This was post dilated with a 3.0 x 20 mm noncompliant balloon in the distal and 14 atmospheric pressure for 30 seconds and in the proximal and at 16 atmospheric pressure for 30 seconds. Intracoronary nitroglycerin administered and angiography was performed. 0%  residual stenosis was evident, TIMI-3 flow was established. There was no evidence of edge dissection. The guidewire was withdrawn out of the body and the guide catheter was engaged and pulled out of the body over the J-wire the was no immediate complication.  Patient tolerated the procedure well. Hemostasis achieved with TR band.   Disposition: Patient will be discharged in AM unless complications with out-patient follow up. A total of 175 cc of contrast was utilized for diagnostic and intervention procedure. Fairly complex procedure due to need for multiple guide catheters.

## 2013-12-06 NOTE — Care Management Note (Addendum)
    Page 1 of 1   12/06/2013     11:52:17 AM CARE MANAGEMENT NOTE 12/06/2013  Patient:  Manuel Carey, Manuel Carey   Account Number:  [id number]  Date Initiated:  12/06/2013  Documentation initiated by:  GRAVES-BIGELOW,Sylis Ketchum  Subjective/Objective Assessment:   Pt admitted for cp increased troponins. S/p cath. Plan for home on effient.     Action/Plan:   CM did provide pt with 30 day free card/co pay card. Benefits check in process and will make pt aware once completed. Pt will need Rx for 30 day free no refills and the original Rx with refills. Pt prefers mail order with 90 day supply.   Anticipated DC Date:  12/07/2013   Anticipated DC Plan:  HOME/SELF CARE      DC Planning Services  CM consult      Choice offered to / List presented to:             Status of service:  Completed, signed off Medicare Important Message given?  NO (If response is "NO", the following Medicare IM given date fields will be blank) Date Medicare IM given:   Medicare IM given by:   Date Additional Medicare IM given:   Additional Medicare IM given by:    Discharge Disposition:  HOME/SELF CARE  Per UR Regulation:  Reviewed for med. necessity/level of care/duration of stay  If discussed at Long Length of Stay Meetings, dates discussed:    Comments:  12-06-13 1145 Tomi Bamberger, RN,BSN [phone] PT COPAY WILL BE $40- 90 DAY MAIL ORDER WILL BE $80- PRIOR AUTH NOT REQUIRED. Pt uses [facility] [address] and effient is available.

## 2013-12-07 LAB — BASIC METABOLIC PANEL
Anion gap: 13 (ref 5–15)
BUN: 11 mg/dL (ref 6–23)
CHLORIDE: 104 meq/L (ref 96–112)
CO2: 26 meq/L (ref 19–32)
Calcium: 8.7 mg/dL (ref 8.4–10.5)
Creatinine, Ser: 0.98 mg/dL (ref 0.50–1.35)
GFR calc Af Amer: >90 mL/min (ref >90)
Glucose, Bld: 83 mg/dL (ref 70–99)
Potassium: 4.1 mEq/L (ref 3.7–5.3)
SODIUM: 143 meq/L (ref 137–147)

## 2013-12-07 LAB — CBC
HCT: 39.4 % (ref 39.0–52.0)
HEMOGLOBIN: 13 g/dL (ref 13.0–17.0)
MCH: 30.7 pg (ref 26.0–34.0)
MCHC: 33 g/dL (ref 30.0–36.0)
MCV: 93.1 fL (ref 78.0–100.0)
Platelets: 177 10*3/uL (ref 150–400)
RBC: 4.23 MIL/uL (ref 4.22–5.81)
RDW: 13.5 % (ref 11.5–15.5)
WBC: 10.2 10*3/uL (ref 4.0–10.5)

## 2013-12-07 MED ORDER — ATORVASTATIN CALCIUM 80 MG PO TABS: 80.0000 mg | ORAL_TABLET | Freq: Every day | ORAL | Status: DC

## 2013-12-07 MED ORDER — PRASUGREL HCL 10 MG PO TABS: 10.0000 mg | ORAL_TABLET | Freq: Every day | ORAL | Status: DC

## 2013-12-07 MED ORDER — METOPROLOL TARTRATE 25 MG PO TABS: 25.0000 mg | ORAL_TABLET | Freq: Two times a day (BID) | ORAL | Status: DC

## 2013-12-07 MED ORDER — ASPIRIN 81 MG PO CHEW: 81.0000 mg | CHEWABLE_TABLET | Freq: Every day | ORAL | Status: AC

## 2013-12-07 NOTE — Discharge Summary (Signed)
Physician Discharge Summary  Patient ID: Manuel Carey MRN: [id number] DOB/AGE: February 23, 1971 43 y.o.  Admit date: 12/03/2013 Discharge date: 12/07/2013  Primary Discharge Diagnosis NSTEMI anterior wall PTCA and stenting of the proximal LAD with implantation of a 3.0 x 33 mm Xience Alpine DES. Secondary Discharge Diagnosis Metabolic syndrome including hypertriglyceridemia, morbid obesity, hyperglycemia  3. 30 run of NSVT on admission, not captured on telemetry due to change in the monitor from ED to floor. No further episodes.  4. Tobacco use disorder   Significant Diagnostic Studies: 12/06/2013: Hemodynamic data:  Left ventricular pressure was 113/2 with LVEDP of 24 mm mercury. Aortic pressure was 112/79 with a mean of 95 mm mercury. There was no pressure gradient across the aortic valve.  Left ventricle: Performed in the RAO projection revealed LVEF of 60%. There was No MR. No wall motion abnormality.  Right coronary artery: The vessel is smooth, normal, Superdominant Dominant. Gives origin to an aberant OM from the PL branch. The right coronary artery is mildly tortuous in the proximal segment, very large caliber vessel with diffuse luminal irregularity. Mild ectasia was evident.  Left main coronary artery is large and normal.  Circumflex coronary artery: A very small vessel, with mild disease  LAD: LAD gives origin to a large diagonal-1(large ramus intermediate equal and and has secondary branches). The diagonal has mild diffuse disease. The ostium of this vessel has 20-30% stenosis.Marland Kitchen LAD has high-grade 90% stenosis in the proximal segment. The remaining vessel is smooth and appears normal.  Interventional data: Single vessel coronary artery disease involving the proximal LAD. Successful PTCA and stenting of the proximal LAD with implantation of a 3.0 x 33 mm Xience Alpine DES. Will need Dual antiplatelet therapy with Effient and ASA 81 mg for at least one year.   Hospital Course:  Patient  admitted to the hospital with chest pain associated with diaphoresis and nausea, presented on Friday, he ruled in for myocardial infarction with a presentation with positive troponin. He had a brief episode of nonsustained ventricular tachycardia on presentation, with no recurrence. He underwent coronary angiography on 12/06/2013, was found to have high-grade stenosis in the proximal LAD for which he underwent successful angioplasty. The following morning he was felt stable for discharge.  Recommendations on discharge: Patient appears to be motivated and smoking cessation. He has also been started on high dose of statin for now. Weight loss was discussed. Patient will need aspirin indefinitely and Belinda for at least a period of one year or more.  Discharge Exam: Blood pressure 116/66, pulse 90, temperature 98.5 F (36.9 C), temperature source Oral, resp. rate 18, height 5\' 9"  (1.753 m), weight 120.657 kg (266 lb), SpO2 97.00%.   General appearance: alert, cooperative, appears stated age and morbidly obese  Eyes: negative findings: conjunctivae and sclerae normal  Neck: no adenopathy, no carotid bruit, no JVD, supple, symmetrical, trachea midline and thyroid not enlarged, symmetric, no tenderness/mass/nodules  Neck: JVP - normal, carotids 2+= without bruits  Resp: rales bilaterally and Diffuse  Chest wall: no tenderness  Cardio: regular rate and rhythm, S1, S2 normal, no murmur, click, rub or gallop  GI: soft, non-tender; bowel sounds normal; no masses, no organomegaly and Pannus present  Extremities: extremities normal, atraumatic, no cyanosis or edema   Labs:   Lab Results  Component Value Date   WBC 10.2 12/07/2013   HGB 13.0 12/07/2013   HCT 39.4 12/07/2013   MCV 93.1 12/07/2013   PLT 177 12/07/2013    Recent Labs  Lab 12/03/13 1836 12/07/13 0320  NA 142 143  K 4.7 4.1  CL 104 104  CO2 24 26  BUN 14 11  CREATININE 1.02 0.98  CALCIUM 8.7 8.7  PROT 6.1  --   BILITOT 0.2*  --    ALKPHOS 76  --   ALT 15  --   AST 17  --   GLUCOSE 114* 83   Lab Results  Component Value Date   TROPONINI 1.36* 12/04/2013    Lipid Panel     Component Value Date/Time   CHOL 138 12/04/2013 0310   TRIG 180* 12/04/2013 0310   HDL 31* 12/04/2013 0310   CHOLHDL 4.5 12/04/2013 0310   VLDL 36 12/04/2013 0310   LDLCALC 71 12/04/2013 0310    EKG: 12/03/2013: normal EKG, normal sinus rhythm, unchanged from previous tracings. EKG 12/06/2013: None CT abnormalities but otherwise no change from 12/03/2013.  Radiology: Dg Chest 2 View  12/03/2013   CLINICAL DATA:  Mid chest pain, intermittent for the past several days, worse today. Current smoker.  EXAM: CHEST  2 VIEW  COMPARISON:  None.  FINDINGS: Cardiomediastinal silhouette unremarkable. Lungs clear. Bronchovascular markings normal. Pulmonary vascularity normal. No visible pleural effusions. No pneumothorax. Mild degenerative changes involving the thoracic spine.  IMPRESSION: No acute cardiopulmonary disease.   Electronically Signed   By: Hulan Saashomas  Lawrence M.D.   On: 12/03/2013 19:10      FOLLOW UP PLANS AND APPOINTMENTS    Medication List         aspirin 81 MG chewable tablet  Chew 1 tablet (81 mg total) by mouth daily.     atorvastatin 80 MG tablet  Commonly known as:  LIPITOR  Take 1 tablet (80 mg total) by mouth daily at 6 PM.     metoprolol tartrate 25 MG tablet  Commonly known as:  LOPRESSOR  Take 1 tablet (25 mg total) by mouth 2 (two) times daily.     prasugrel 10 MG Tabs tablet  Commonly known as:  EFFIENT  Take 1 tablet (10 mg total) by mouth daily.     zolpidem 10 MG tablet  Commonly known as:  AMBIEN  Take 10 mg by mouth at bedtime as needed for sleep.           Follow-up Information   Follow up with Earl ManyVyas, Chandra K, MD On 12/21/2013. (1:30 Appointment. Come 15 minutes early)    Specialty:  Cardiology   Contact information:   [facility] [address] [place]  [id number] [phone]        Pamella PertGANJI,JAGADEESH R, MD 12/07/2013, 9:23 AM  Pager: [phone] Office: [phone] If no answer: [phone]

## 2013-12-07 NOTE — Progress Notes (Signed)
[id number] Cardiac Rehab Pt states that he has walked in hall, denies any cp or SOB. Completed MI and stent education with pt. He voices understanding. He declines Outpt. CRP due to his work hours. We discussed smoking cessation. He states that he has already quit. I gave him tips for quitting, coaching contact information and quit smart class information. Pt seems very motivated to quitting "cold [place]". He quit that way for six months, but restarted. We discussed relapse and ways to avoid it.Pt seems ready to make lifestyle changes. Beatrix Fetters, RN 12/07/2013 8:45 AM

## 2013-12-07 NOTE — Discharge Instructions (Addendum)
Acute Coronary Syndrome  Acute coronary syndrome (ACS) is an urgent problem in which the blood and oxygen supply to the heart is critically deficient. ACS requires hospitalization because one or more coronary arteries may be blocked.  ACS represents a range of conditions including:  · Previous angina that is now unstable, lasts longer, happens at rest, or is more intense.  · A heart attack, with heart muscle cell injury and death.  There are three vital coronary arteries that supply the heart muscle with blood and oxygen so that it can pump blood effectively. If blockages to these arteries develop, blood flow to the heart muscle is reduced. If the heart does not get enough blood, angina may occur as the first warning sign.  SYMPTOMS   · The most common signs of angina include:  ¨ Tightness or squeezing in the chest.  ¨ Feeling of heaviness on the chest.  ¨ Discomfort in the arms, neck, back, or jaw.  ¨ Shortness of breath and nausea.  ¨ Cold, wet skin.  · Angina is usually brought on by physical effort or excitement which increase the oxygen needs of the heart. These states increase the blood flow needs of the heart beyond what can be delivered.  · Other symptoms that are not as common include:  ¨ Fatigue  ¨ Unexplained feelings of nervousness or anxiety  ¨ Weakness  ¨ Diarrhea  · Sometimes, you may not have noticed any symptoms at all but still suffered a cardiac injury.  TREATMENT   · Medicines to help discomfort may include nitroglycerin (nitro) in the form of tablets or a spray for rapid relief, or longer-acting forms such as cream, patches, or capsules. (Be aware that there are many side effects and possible interactions with other drugs).  · Other medicines may be used to help the heart pump better.  · Procedures to open blocked arteries including angioplasty or stent placement to keep the arteries open.  · Open heart surgery may be needed when there are many blockages or they are in critical locations that  are best treated with surgery.  HOME CARE INSTRUCTIONS   · Do not use any tobacco products including cigarettes, chewing tobacco, or electronic cigarettes.  · Take one baby or adult aspirin daily, if your health care provider advises. This helps reduce the risk of a heart attack.  · It is very important that you follow the angina treatment prescribed by your health care provider. Make arrangements for proper follow-up care.  · Eat a heart healthy diet with salt and fat restrictions as advised.  · Regular exercise is good for you as long as it does not cause discomfort. Do not begin any new type of exercise until you check with your health care provider.  · If you are overweight, you should lose weight.  · Try to maintain normal blood lipid levels.  · Keep your blood pressure under control as recommended by your health care provider.  · You should tell your health care provider right away about any increase in the severity or frequency of your chest discomfort or angina attacks. When you have angina, you should stop what you are doing and sit down. This may bring relief in 3 to 5 minutes. If your health care provider has prescribed nitro, take it as directed.  · If your health care provider has given you a follow-up appointment, it is very important to keep that appointment. Not keeping the appointment could result in a chronic or   of breath.  You feel faint, lightheaded, or pass out.  Your chest discomfort gets worse.  You are sweating or experience sudden profound fatigue.  You do not get relief of your chest pain after 3 doses of nitro.  Your discomfort lasts longer than 15 minutes. MAKE SURE YOU:   Understand these instructions.  Will watch your condition.  Will get help right  away if you are not doing well or get worse.  Take all medicines as directed by your health care provider. Document Released: 05/06/2005 Document Revised: 05/11/2013 Document Reviewed: 12/08/2007 [facility] Patient Information 2015 [place], [place]. This information is not intended to replace advice given to you by your health care provider. Make sure you discuss any questions you have with your health care provider.   Radial Site Care Refer to this sheet in the next few weeks. These instructions provide you with information on caring for yourself after your procedure. Your caregiver may also give you more specific instructions. Your treatment has been planned according to current medical practices, but problems sometimes occur. Call your caregiver if you have any problems or questions after your procedure. HOME CARE INSTRUCTIONS You may shower the day after the procedure.Remove the bandage (dressing) and gently wash the site with plain soap and water.Gently pat the site dry. Do not apply powder or lotion to the site. Do not submerge the affected site in water for 3 to 5 days. Inspect the site at least twice daily. Do not flex or bend the affected arm for 24 hours. No lifting over 5 pounds (2.3 kg) for 5 days after your procedure. Do not drive home if you are discharged the same day of the procedure. Have someone else drive you. You may drive 24 hours after the procedure unless otherwise instructed by your caregiver. Do not operate machinery or power tools for 24 hours. A responsible adult should be with you for the first 24 hours after you arrive home. What to expect: Any bruising will usually fade within 1 to 2 weeks. Blood that collects in the tissue (hematoma) may be painful to the touch. It should usually decrease in size and tenderness within 1 to 2 weeks. SEEK IMMEDIATE MEDICAL CARE IF: You have unusual pain at the radial site. You have redness, warmth, swelling, or pain at the radial  site. You have drainage (other than a small amount of blood on the dressing). You have chills. You have a fever or persistent symptoms for more than 72 hours. You have a fever and your symptoms suddenly get worse. Your arm becomes pale, cool, tingly, or numb. You have heavy bleeding from the site. Hold pressure on the site. Document Released: 06/08/2010 Document Revised: 07/29/2011 Document Reviewed: 06/08/2010 [facility] Patient Information 2015 [place], [place]. This information is not intended to replace advice given to you by your health care provider. Make sure you discuss any questions you have with your health care provider.

## 2014-04-28 ENCOUNTER — Encounter (HOSPITAL_COMMUNITY): Payer: Self-pay | Admitting: Cardiology

## 2016-02-20 ENCOUNTER — Other Ambulatory Visit: Payer: Self-pay | Admitting: Nurse Practitioner

## 2016-02-20 ENCOUNTER — Ambulatory Visit
Admission: RE | Admit: 2016-02-20 | Discharge: 2016-02-20 | Disposition: A | Discharge status: Home / Self Care | Payer: Worker's Compensation | Source: Ambulatory Visit | Attending: Nurse Practitioner | Admitting: Nurse Practitioner

## 2016-02-20 DIAGNOSIS — S86912A Strain of unspecified muscle(s) and tendon(s) at lower leg level, left leg, initial encounter: Secondary | ICD-10-CM

## 2016-09-10 DIAGNOSIS — R609 Edema, unspecified: Secondary | ICD-10-CM | POA: Diagnosis not present

## 2016-09-16 DIAGNOSIS — I2510 Atherosclerotic heart disease of native coronary artery without angina pectoris: Secondary | ICD-10-CM | POA: Diagnosis not present

## 2016-09-16 DIAGNOSIS — I10 Essential (primary) hypertension: Secondary | ICD-10-CM | POA: Diagnosis not present

## 2017-05-26 DIAGNOSIS — R05 Cough: Secondary | ICD-10-CM | POA: Diagnosis not present

## 2017-05-26 DIAGNOSIS — Z6841 Body Mass Index (BMI) 40.0 and over, adult: Secondary | ICD-10-CM | POA: Insufficient documentation

## 2017-05-26 DIAGNOSIS — J0100 Acute maxillary sinusitis, unspecified: Secondary | ICD-10-CM | POA: Diagnosis not present

## 2017-06-13 DIAGNOSIS — I2510 Atherosclerotic heart disease of native coronary artery without angina pectoris: Secondary | ICD-10-CM | POA: Diagnosis not present

## 2017-06-13 DIAGNOSIS — I10 Essential (primary) hypertension: Secondary | ICD-10-CM | POA: Diagnosis not present

## 2017-09-03 ENCOUNTER — Other Ambulatory Visit: Payer: Self-pay | Admitting: Family Medicine

## 2017-09-03 ENCOUNTER — Encounter: Payer: Self-pay | Admitting: Family Medicine

## 2017-09-08 DIAGNOSIS — E785 Hyperlipidemia, unspecified: Secondary | ICD-10-CM | POA: Diagnosis not present

## 2017-09-08 DIAGNOSIS — I2510 Atherosclerotic heart disease of native coronary artery without angina pectoris: Secondary | ICD-10-CM | POA: Diagnosis not present

## 2017-09-16 ENCOUNTER — Encounter: Payer: Self-pay | Admitting: Family Medicine

## 2017-09-16 ENCOUNTER — Ambulatory Visit (INDEPENDENT_AMBULATORY_CARE_PROVIDER_SITE_OTHER): Payer: 59 | Admitting: Family Medicine

## 2017-09-16 VITALS — BP 130/78 | HR 77 | Temp 98.3°F | Resp 18 | Ht 69.0 in | Wt 339.0 lb

## 2017-09-16 DIAGNOSIS — I10 Essential (primary) hypertension: Secondary | ICD-10-CM | POA: Diagnosis not present

## 2017-09-16 DIAGNOSIS — I252 Old myocardial infarction: Secondary | ICD-10-CM

## 2017-09-16 DIAGNOSIS — I219 Acute myocardial infarction, unspecified: Secondary | ICD-10-CM | POA: Insufficient documentation

## 2017-09-16 DIAGNOSIS — E7800 Pure hypercholesterolemia, unspecified: Secondary | ICD-10-CM

## 2017-09-16 NOTE — Progress Notes (Signed)
Subjective:    Patient ID: Manuel Carey, male    DOB: [birth year], 47 y.o.   MRN: [id number]  HPI  Patient is a very pleasant 47 year old Caucasian male here today to establish care.  Past medical history is significant for a non-ST elevation myocardial infarction in 2015.  Patient underwent cardiac catheterization at that time and had a Xience drug-eluting stent placed in the proximal LAD.  He is followed by cardiology, Dr. Sherril Croon.  He continues to remain on dual antiplatelet therapy.  He is also on a combination of metoprolol and high-dose Lipitor.  Unfortunately he continues to smoke about 1/2 pack of cigarettes a day.  He is try to quit twice both previous times with Chantix.  Patient even has a starter pack for Chantix at home but has yet to start back on the medication.  He is interested in quitting.  He is also interested in weight loss.  Patient reached his peak weight of 350 pounds recently.  He started cutting back on his diet and is now down to 339 pounds.  He is not engaging in any regular aerobic exercise.  Patient has not been evaluated for obstructive sleep apnea.  He has a very large neck circumference with a short mandible.  However he denies any snoring, apnea episodes, or hypersomnolence.  However the patient also lives alone. Past Medical History:  Diagnosis Date  . Environmental allergies   . Heart attack (HCC)    2015, DES x 1, xience stent proximal LAD  . HTN (hypertension)   . Hyperlipemia    Past Surgical History:  Procedure Laterality Date  . KNEE ARTHROSCOPY W/ MENISCECTOMY     left knee  . LEFT HEART CATHETERIZATION WITH CORONARY ANGIOGRAM N/A 12/06/2013   Procedure: LEFT HEART CATHETERIZATION WITH CORONARY ANGIOGRAM;  Surgeon: [name], MD;  Location: [facility] CATH LAB;  Service: Cardiovascular;  Laterality: N/A;  . PERCUTANEOUS STENT INTERVENTION  12/06/2013   Procedure: PERCUTANEOUS STENT INTERVENTION;  Surgeon: [name], MD;  Location: [facility] CATH LAB;   Service: Cardiovascular;;   Current Outpatient Medications on File Prior to Visit  Medication Sig Dispense Refill  . aspirin 81 MG chewable tablet Chew 1 tablet (81 mg total) by mouth daily.    Marland Kitchen atorvastatin (LIPITOR) 80 MG tablet Take 1 tablet (80 mg total) by mouth daily at 6 PM. 30 tablet 3  . clopidogrel (PLAVIX) 75 MG tablet Take 1 tablet (75 mg total) by mouth daily. 90 tablet 3  . metoprolol tartrate (LOPRESSOR) 25 MG tablet Take 1 tablet (25 mg total) by mouth 2 (two) times daily. 60 tablet 1   No current facility-administered medications on file prior to visit.    No Known Allergies Social History   Socioeconomic History  . Marital status: Single    Spouse name: Not on file  . Number of children: Not on file  . Years of education: Not on file  . Highest education level: Not on file  Occupational History  . Not on file  Social Needs  . Financial resource strain: Not on file  . Food insecurity:    Worry: Not on file    Inability: Not on file  . Transportation needs:    Medical: Not on file    Non-medical: Not on file  Tobacco Use  . Smoking status: Current Every Day Smoker    Packs/day: 0.50    Years: 25.00    Pack years: 12.50    Types: Cigarettes  .  Smokeless tobacco: Never Used  Substance and Sexual Activity  . Alcohol use: Not Currently  . Drug use: Never  . Sexual activity: Not on file  Lifestyle  . Physical activity:    Days per week: Not on file    Minutes per session: Not on file  . Stress: Not on file  Relationships  . Social connections:    Talks on phone: Not on file    Gets together: Not on file    Attends religious service: Not on file    Active member of club or organization: Not on file    Attends meetings of clubs or organizations: Not on file    Relationship status: Not on file  . Intimate partner violence:    Fear of current or ex partner: Not on file    Emotionally abused: Not on file    Physically abused: Not on file    Forced  sexual activity: Not on file  Other Topics Concern  . Not on file  Social History Narrative  . Not on file   Family History  Problem Relation Age of Onset  . Cancer Mother        breast  . Diabetes Father   . Hyperlipidemia Father   . Hypertension Father      Review of Systems  All other systems reviewed and are negative.      Objective:   Physical Exam  Constitutional: He appears well-developed and well-nourished. No distress.  HENT:  Head: Normocephalic and atraumatic.  Eyes: Pupils are equal, round, and reactive to light. Conjunctivae are normal. No scleral icterus.  Neck: No JVD present. No thyromegaly present.  Cardiovascular: Normal rate, regular rhythm and normal heart sounds. Exam reveals no gallop and no friction rub.  No murmur heard. Pulmonary/Chest: Effort normal and breath sounds normal. No stridor. No respiratory distress. He has no wheezes. He has no rales.  Abdominal: Soft. Bowel sounds are normal. He exhibits no distension. There is no tenderness. There is no guarding.  Musculoskeletal: He exhibits no edema.  Lymphadenopathy:    He has no cervical adenopathy.  Skin: He is not diaphoretic.  Vitals reviewed.         Assessment & Plan:  History of MI (myocardial infarction)  Benign essential HTN  Pure hypercholesterolemia  Morbid obesity (HCC)  Patient has 2 large risk factors that we spent the majority of time discussing today.  First is his tobacco abuse.  I recommended that he start back on Chantix immediately and then continue Chantix for as long as needed to facilitate smoking cessation.  Patient will consider this.  His morbid obesity is concerning as well.  I recommended less than 1500 -calorie/day diet.  I have recommended gradually increasing aerobic exercise up to a goal of 30 minutes a day 5 days a week.  I have also recommended starting the patient on Saxenda and uptitrating the patient to 3 mg a day to help facilitate weight loss.  We  also discussed screening for obstructive sleep apnea but he declines this at the present time.  We will begin by focusing on weight loss through diet, lifestyle changes, and medication.  Recheck in 3 months on medication to reassess weight loss.  We also tentatively discussed gastric bypass however the patient is against pursuing that option at this time

## 2017-09-17 ENCOUNTER — Other Ambulatory Visit: Payer: Self-pay | Admitting: Family Medicine

## 2017-09-17 MED ORDER — LIRAGLUTIDE -WEIGHT MANAGEMENT 18 MG/3ML ~~LOC~~ SOPN: 1.8000 mg | PEN_INJECTOR | Freq: Every day | SUBCUTANEOUS | 3 refills | Status: DC

## 2017-11-07 ENCOUNTER — Telehealth: Payer: Self-pay | Admitting: Family Medicine

## 2017-11-07 ENCOUNTER — Encounter: Payer: 59 | Admitting: Family Medicine

## 2017-11-07 NOTE — Telephone Encounter (Signed)
Patient called in with c/o swelling to the top of his foot with bruising. Asked patient if he had any known injury. Patient informed me that he had a fall on 10/27/17 but he fell on his leg. Before talking with patient our front desk got patient scheduled for Monday 11/10/17. Patient informed me that site is not painful or warm to the touch. Advised patient to keep appointment for Monday but if pain worsens, warmth to site occurs, and redness that he needs to be seen at the ER to have foot evaluated immediately. Patient verbalized understanding.

## 2017-11-10 ENCOUNTER — Encounter: Payer: Self-pay | Admitting: Physician Assistant

## 2017-11-10 ENCOUNTER — Other Ambulatory Visit: Payer: Self-pay

## 2017-11-10 ENCOUNTER — Ambulatory Visit (HOSPITAL_COMMUNITY)
Admission: RE | Admit: 2017-11-10 | Discharge: 2017-11-10 | Disposition: A | Discharge status: Home / Self Care | Payer: 59 | Source: Ambulatory Visit | Attending: Physician Assistant | Admitting: Physician Assistant

## 2017-11-10 ENCOUNTER — Ambulatory Visit: Payer: 59 | Admitting: Physician Assistant

## 2017-11-10 VITALS — BP 116/64 | HR 70 | Temp 98.1°F | Resp 20 | Ht 69.0 in | Wt 328.6 lb

## 2017-11-10 DIAGNOSIS — M79605 Pain in left leg: Secondary | ICD-10-CM

## 2017-11-10 DIAGNOSIS — M7989 Other specified soft tissue disorders: Secondary | ICD-10-CM

## 2017-11-10 NOTE — Progress Notes (Signed)
Patient ID: Manuel Carey MRN: [id number], DOB: Jun 29, 1970, 47 y.o. Date of Encounter: 11/10/2017, 4:58 PM    Chief Complaint:  Chief Complaint  Patient presents with  . left leg swollen     HPI: 47 y.o. year old male presnts with above.   I reviewed his office note with Dr. Tanya Nones from 09/16/2017 regarding his history.  See that note for details.  He has a history of MI in 2015.  History of high dose Lipitor and history of smoking.  Today he reports that on June 10 he fell when at work.  States that they had been in a room having a meeting. When he went to leave from the meeting, there was a raised seal plate at the doorway but then on the outer side of that, there was a drop down to the asphalt outside.  He tripped on that seal plate and fell on his left knee.  Says that about 1 or 2 days later saw a little bit of swollen area just distal to the left knee at the lateral aspect.  Otherwise had not been having any issues with the left leg since then.  States that this past Friday "left foot looked puffed up like a marshmallow ".  Also his left shin felt a little bit sore.  Says that that area on the left shin just feels sensitive to the touch but otherwise does not have any discomfort there.  Feels no achy discomfort in the leg.  Is able to sleep at night with out any discomfort in the leg.  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  Says it involves a lot of climbing in and out of the trucks throughout the day.  Does no long distance driving.  Has not been experiencing any shortness of breath.  Has not been experiencing any chest pain.  No pleuritic chest pain.     Home Meds:   Outpatient Medications Prior to Visit  Medication Sig Dispense Refill  . aspirin 81 MG chewable tablet Chew 1 tablet (81 mg total) by mouth daily.    Marland Kitchen atorvastatin (LIPITOR) 80 MG tablet Take 1 tablet (80 mg total) by mouth daily at 6 PM. 30 tablet  3  . clopidogrel (PLAVIX) 75 MG tablet Take 1 tablet (75 mg total) by mouth daily. 90 tablet 3  . metoprolol tartrate (LOPRESSOR) 25 MG tablet Take 1 tablet (25 mg total) by mouth 2 (two) times daily. 60 tablet 1  . SAXENDA 18 MG/3ML SOPN INJECT 0.6 MG INTO THE SKIN DAILY. INCREASE BY 0.6MG  WEEKLY UNTIL MAX DOSE OF 3MG  IS REACHED 15 mL 3   No facility-administered medications prior to visit.     Allergies: No Known Allergies    Review of Systems: See HPI for pertinent ROS. All other ROS negative.    Physical Exam: Blood pressure 116/64, pulse 70, temperature 98.1 F (36.7 C), temperature source Oral, resp. rate 20, height 5\' 9"  (1.753 m), weight (!) 149.1 kg (328 lb 9.6 oz), SpO2 98 %., Body mass index is 48.53 kg/m. General:  Obese WM. Appears in no acute distress. Neck: Supple. No thyromegaly. No lymphadenopathy. Lungs: Clear bilaterally to auscultation without wheezes, rales, or rhonchi. Breathing is unlabored. Heart: Regular rhythm. No murmurs, rubs, or gallops. Msk:  Strength and tone normal for age. Extremities/Skin: Warm and dry.  Left lower leg does appear larger than right.  Measured at largest  area of calf bilaterally: --Right-- 43 cm --Left-- 47 cm Measured at smallest area of of ankle / at same level of ankle bilaterally-- --Right-- 25cm  -- Left-- 26 cm Inspection of the left knee and left leg shows no erythema. Palpation reveals no warmth. There is one area on his anterior shin that he says feels sensitive to the touch but otherwise there is no area of pain or tenderness. Neuro: Alert and oriented X 3. Moves all extremities spontaneously. Gait is normal. CNII-XII grossly in tact. Psych:  Responds to questions appropriately with a normal affect.     ASSESSMENT AND PLAN:  47 y.o. year old male with  1. Pain and swelling of left lower extremity Will obtain venous Doppler to rule out DVT.  Will follow-up with him once I get this results. Reviewed that he has no  recent labs in the computer.  He states that he just saw Dr. Jacinto Halim and had labs through [facility] about 3 months ago.  States that he has labs routinely with Dr. Jacinto Halim. - VAS [place] LOWER EXTREMITY VENOUS (DVT); Future   Signed, Shon [place], [place], [facility] 11/10/2017 4:58 PM

## 2017-11-10 NOTE — Progress Notes (Signed)
Left lower extremity venous duplex completed. There is no evidence of DVT or Baker's cyst. Toma Deiters, RVS  11/10/2017, 4:58 PM

## 2017-12-08 ENCOUNTER — Encounter: Payer: Self-pay | Admitting: Physician Assistant

## 2017-12-09 ENCOUNTER — Ambulatory Visit (INDEPENDENT_AMBULATORY_CARE_PROVIDER_SITE_OTHER): Payer: 59 | Admitting: Family Medicine

## 2017-12-09 ENCOUNTER — Encounter: Payer: Self-pay | Admitting: Family Medicine

## 2017-12-09 VITALS — BP 126/70 | HR 70 | Temp 98.3°F | Resp 18 | Ht 69.0 in | Wt 332.0 lb

## 2017-12-09 DIAGNOSIS — I10 Essential (primary) hypertension: Secondary | ICD-10-CM | POA: Diagnosis not present

## 2017-12-09 DIAGNOSIS — R062 Wheezing: Secondary | ICD-10-CM

## 2017-12-09 DIAGNOSIS — Z0000 Encounter for general adult medical examination without abnormal findings: Secondary | ICD-10-CM | POA: Diagnosis not present

## 2017-12-09 DIAGNOSIS — I252 Old myocardial infarction: Secondary | ICD-10-CM

## 2017-12-09 DIAGNOSIS — F17200 Nicotine dependence, unspecified, uncomplicated: Secondary | ICD-10-CM

## 2017-12-09 DIAGNOSIS — E7800 Pure hypercholesterolemia, unspecified: Secondary | ICD-10-CM

## 2017-12-09 LAB — CBC WITH DIFFERENTIAL/PLATELET
BASOS ABS: 81 {cells}/uL (ref 0–200)
Basophils Relative: 0.9 %
EOS ABS: 171 {cells}/uL (ref 15–500)
Eosinophils Relative: 1.9 %
HEMATOCRIT: 41.2 % (ref 38.5–50.0)
Hemoglobin: 14 g/dL (ref 13.2–17.1)
LYMPHS ABS: 3069 {cells}/uL (ref 850–3900)
MCH: 30.5 pg (ref 27.0–33.0)
MCHC: 34 g/dL (ref 32.0–36.0)
MCV: 89.8 fL (ref 80.0–100.0)
MPV: 9.1 fL (ref 7.5–12.5)
Monocytes Relative: 5.4 %
NEUTROS PCT: 57.7 %
Neutro Abs: 5193 cells/uL (ref 1500–7800)
PLATELETS: 220 10*3/uL (ref 140–400)
RBC: 4.59 10*6/uL (ref 4.20–5.80)
RDW: 13.3 % (ref 11.0–15.0)
TOTAL LYMPHOCYTE: 34.1 %
WBC: 9 10*3/uL (ref 3.8–10.8)
WBCMIX: 486 {cells}/uL (ref 200–950)

## 2017-12-09 LAB — COMPLETE METABOLIC PANEL WITH GFR
AG RATIO: 1.8 (calc) (ref 1.0–2.5)
ALKALINE PHOSPHATASE (APISO): 83 U/L (ref 40–115)
ALT: 22 U/L (ref 9–46)
AST: 17 U/L (ref 10–40)
Albumin: 3.8 g/dL (ref 3.6–5.1)
BUN: 10 mg/dL (ref 7–25)
CALCIUM: 8.5 mg/dL — AB (ref 8.6–10.3)
CHLORIDE: 107 mmol/L (ref 98–110)
CO2: 27 mmol/L (ref 20–32)
CREATININE: 0.85 mg/dL (ref 0.60–1.35)
GFR, Est African American: 119 mL/min/{1.73_m2} (ref >=60)
GFR, Est Non African American: 103 mL/min/{1.73_m2} (ref >=60)
GLOBULIN: 2.1 g/dL (ref 1.9–3.7)
Glucose, Bld: 119 mg/dL — ABNORMAL HIGH (ref 65–99)
POTASSIUM: 4.3 mmol/L (ref 3.5–5.3)
Sodium: 144 mmol/L (ref 135–146)
Total Bilirubin: 0.5 mg/dL (ref 0.2–1.2)
Total Protein: 5.9 g/dL — ABNORMAL LOW (ref 6.1–8.1)

## 2017-12-09 LAB — LIPID PANEL
CHOLESTEROL: 91 mg/dL (ref <200)
HDL: 30 mg/dL — AB (ref >40)
LDL Cholesterol (Calc): 40 mg/dL (calc)
Non-HDL Cholesterol (Calc): 61 mg/dL (calc) (ref <130)
Total CHOL/HDL Ratio: 3 (calc) (ref <5.0)
Triglycerides: 130 mg/dL (ref <150)

## 2017-12-09 MED ORDER — PREDNISONE 20 MG PO TABS: ORAL_TABLET | ORAL | 0 refills | Status: DC

## 2017-12-09 MED ORDER — LEVOCETIRIZINE DIHYDROCHLORIDE 5 MG PO TABS: 5.0000 mg | ORAL_TABLET | Freq: Every evening | ORAL | 0 refills | Status: DC

## 2017-12-09 MED ORDER — FLUTICASONE PROPIONATE 50 MCG/ACT NA SUSP: 2.0000 | Freq: Every day | NASAL | 6 refills | Status: DC

## 2017-12-09 NOTE — Progress Notes (Signed)
Subjective:    Patient ID: Manuel Carey, male    DOB: Dec 23, 1970, 47 y.o.   MRN: [id number]  HPI 08/2017 Patient is a very pleasant 47 year old Caucasian male here today to establish care.  Past medical history is significant for a non-ST elevation myocardial infarction in 2015.  Patient underwent cardiac catheterization at that time and had a Xience drug-eluting stent placed in the proximal LAD.  He is followed by cardiology, Dr. Sherril Croon.  He continues to remain on dual antiplatelet therapy.  He is also on a combination of metoprolol and high-dose Lipitor.  Unfortunately he continues to smoke about 1/2 pack of cigarettes a day.  He is try to quit twice both previous times with Chantix.  Patient even has a starter pack for Chantix at home but has yet to start back on the medication.  He is interested in quitting.  He is also interested in weight loss.  Patient reached his peak weight of 350 pounds recently.  He started cutting back on his diet and is now down to 339 pounds.  He is not engaging in any regular aerobic exercise.  Patient has not been evaluated for obstructive sleep apnea.  He has a very large neck circumference with a short mandible.  However he denies any snoring, apnea episodes, or hypersomnolence.  However the patient also lives alone.  At that time, my plan was: Patient has 2 large risk factors that we spent the majority of time discussing today.  First is his tobacco abuse.  I recommended that he start back on Chantix immediately and then continue Chantix for as long as needed to facilitate smoking cessation.  Patient will consider this.  His morbid obesity is concerning as well.  I recommended less than 1500 -calorie/day diet.  I have recommended gradually increasing aerobic exercise up to a goal of 30 minutes a day 5 days a week.  I have also recommended starting the patient on Saxenda and uptitrating the patient to 3 mg a day to help facilitate weight loss.  We also discussed screening  for obstructive sleep apnea but he declines this at the present time.  We will begin by focusing on weight loss through diet, lifestyle changes, and medication.  Recheck in 3 months on medication to reassess weight loss.  We also tentatively discussed gastric bypass however the patient is against pursuing that option at this time  12/09/17 Patient is here today for complete physical exam.  Patient states that he has been dealing with postnasal drainage, head congestion, and wheezing and coughing now for more than a month.  He denies any fever.  He denies any purulent sputum.  He denies any head pain or sinus pain.  He does report postnasal drip, thick phlegm in his throat that is worse when he is lying down at night, a cough productive of white sputum.  He is also audibly wheezing on his exam today.  Unfortunately the patient continues to smoke.  His insurance would not cover any weight loss medication.  He is still hesitant to consider gastric bypass.  Otherwise he is doing well with no concerns.  His blood pressure is well controlled today at 126/70.  He is not engaging in any specific diet or any specific exercise program Past Medical History:  Diagnosis Date  . Environmental allergies   . Heart attack (HCC)    2015, DES x 1, xience stent proximal LAD  . HTN (hypertension)   . Hyperlipemia    Past  Surgical History:  Procedure Laterality Date  . KNEE ARTHROSCOPY W/ MENISCECTOMY     left knee  . LEFT HEART CATHETERIZATION WITH CORONARY ANGIOGRAM N/A 12/06/2013   Procedure: LEFT HEART CATHETERIZATION WITH CORONARY ANGIOGRAM;  Surgeon: [name], MD;  Location: [facility] CATH LAB;  Service: Cardiovascular;  Laterality: N/A;  . PERCUTANEOUS STENT INTERVENTION  12/06/2013   Procedure: PERCUTANEOUS STENT INTERVENTION;  Surgeon: [name], MD;  Location: [facility] CATH LAB;  Service: Cardiovascular;;   Current Outpatient Medications on File Prior to Visit  Medication Sig Dispense Refill  . aspirin  81 MG chewable tablet Chew 1 tablet (81 mg total) by mouth daily.    Marland Kitchen atorvastatin (LIPITOR) 80 MG tablet Take 1 tablet (80 mg total) by mouth daily at 6 PM. 30 tablet 3  . clopidogrel (PLAVIX) 75 MG tablet Take 1 tablet (75 mg total) by mouth daily. 90 tablet 3  . metoprolol tartrate (LOPRESSOR) 25 MG tablet Take 1 tablet (25 mg total) by mouth 2 (two) times daily. 60 tablet 1   No current facility-administered medications on file prior to visit.    No Known Allergies Social History   Socioeconomic History  . Marital status: Single    Spouse name: Not on file  . Number of children: Not on file  . Years of education: Not on file  . Highest education level: Not on file  Occupational History  . Not on file  Social Needs  . Financial resource strain: Not on file  . Food insecurity:    Worry: Not on file    Inability: Not on file  . Transportation needs:    Medical: Not on file    Non-medical: Not on file  Tobacco Use  . Smoking status: Current Every Day Smoker    Packs/day: 0.50    Years: 25.00    Pack years: 12.50    Types: Cigarettes  . Smokeless tobacco: Never Used  Substance and Sexual Activity  . Alcohol use: Not Currently  . Drug use: Never  . Sexual activity: Not on file  Lifestyle  . Physical activity:    Days per week: Not on file    Minutes per session: Not on file  . Stress: Not on file  Relationships  . Social connections:    Talks on phone: Not on file    Gets together: Not on file    Attends religious service: Not on file    Active member of club or organization: Not on file    Attends meetings of clubs or organizations: Not on file    Relationship status: Not on file  . Intimate partner violence:    Fear of current or ex partner: Not on file    Emotionally abused: Not on file    Physically abused: Not on file    Forced sexual activity: Not on file  Other Topics Concern  . Not on file  Social History Narrative  . Not on file   Family History    Problem Relation Age of Onset  . Cancer Mother        breast  . Diabetes Father   . Hyperlipidemia Father   . Hypertension Father      Review of Systems  All other systems reviewed and are negative.      Objective:   Physical Exam  Constitutional: He appears well-developed and well-nourished. No distress.  HENT:  Head: Normocephalic and atraumatic.  Eyes: Pupils are equal, round, and reactive to light. Conjunctivae  are normal. No scleral icterus.  Neck: No JVD present. No thyromegaly present.  Cardiovascular: Normal rate, regular rhythm and normal heart sounds. Exam reveals no gallop and no friction rub.  No murmur heard. Pulmonary/Chest: Effort normal and breath sounds normal. No stridor. No respiratory distress. He has no wheezes. He has no rales.  Abdominal: Soft. Bowel sounds are normal. He exhibits no distension. There is no tenderness. There is no guarding.  Musculoskeletal: He exhibits no edema.  Lymphadenopathy:    He has no cervical adenopathy.  Skin: He is not diaphoretic.  Vitals reviewed.         Assessment & Plan:  Pure hypercholesterolemia - Plan: CBC with Differential/Platelet, COMPLETE METABOLIC PANEL WITH GFR, Lipid panel  History of MI (myocardial infarction)  Benign essential HTN  Morbid obesity (HCC)  General medical exam  Wheezing  Smoker unmotivated to quit  I believe the patient has sinusitis causing bronchospasm and bronchitis.  I am also concerned that the wheezing may represent underlying mild COPD.  Continue to encourage smoking cessation.  Meanwhile treat the patient with a prednisone taper pack due to the bronchospasm and wheezing, Flonase 2 sprays each nostril daily and Xyzal 5 mg daily for postnasal drip and sinus congestion.  Reassess in 2 weeks if no better or sooner if worse.  Regarding his overall health, I believe the patient has 3 large risk factors.  These risk factors are his weight, his smoking and tobacco abuse, and the  third is I suspect obstructive sleep apnea based on his body habitus, neck circumference, and mandible length.  I believe these 3 factors increase his risk of cardiovascular mortality the future.  I have encouraged therapeutic lifestyle changes including diet exercise and weight loss.  I have also recommended considering a gastric bypass.  He is hesitant to consider that at the present time.  I am encouraging him to quit smoking.  I have also recommended a referral for a sleep study to evaluate and treat possible underlying obstructive sleep apnea.  Patient will consider this in the future.  Meanwhile I will check a CBC, CMP, and a fasting lipid panel.

## 2017-12-11 ENCOUNTER — Encounter (INDEPENDENT_AMBULATORY_CARE_PROVIDER_SITE_OTHER): Payer: Self-pay

## 2017-12-11 ENCOUNTER — Encounter: Payer: Self-pay | Admitting: Family Medicine

## 2017-12-11 DIAGNOSIS — R7303 Prediabetes: Secondary | ICD-10-CM | POA: Insufficient documentation

## 2017-12-16 ENCOUNTER — Encounter: Payer: Self-pay | Admitting: Physician Assistant

## 2017-12-22 ENCOUNTER — Encounter: Payer: Self-pay | Admitting: Physician Assistant

## 2017-12-24 ENCOUNTER — Telehealth: Payer: Self-pay

## 2017-12-24 NOTE — Telephone Encounter (Signed)
Call placed to patient after he emailed asking about a form that was faxed to [employer]. I called patient to confirm to him that the form had been faxed. Patient states they did receive the form but they denied the claim because it was work related.

## 2018-01-05 ENCOUNTER — Other Ambulatory Visit: Payer: Self-pay | Admitting: Family Medicine

## 2018-02-06 ENCOUNTER — Encounter: Payer: Self-pay | Admitting: Family Medicine

## 2018-04-08 ENCOUNTER — Encounter: Payer: Self-pay | Admitting: Family Medicine

## 2018-05-25 DIAGNOSIS — I10 Essential (primary) hypertension: Secondary | ICD-10-CM | POA: Diagnosis not present

## 2018-05-25 DIAGNOSIS — F17200 Nicotine dependence, unspecified, uncomplicated: Secondary | ICD-10-CM | POA: Diagnosis not present

## 2018-05-25 DIAGNOSIS — I2510 Atherosclerotic heart disease of native coronary artery without angina pectoris: Secondary | ICD-10-CM | POA: Diagnosis not present

## 2018-08-10 ENCOUNTER — Encounter: Payer: Self-pay | Admitting: Family Medicine

## 2018-08-10 ENCOUNTER — Other Ambulatory Visit: Payer: Self-pay

## 2018-08-10 ENCOUNTER — Ambulatory Visit: Payer: 59 | Admitting: Family Medicine

## 2018-08-10 VITALS — BP 130/78 | HR 64 | Temp 98.7°F | Resp 18 | Ht 69.0 in | Wt 331.0 lb

## 2018-08-10 DIAGNOSIS — J302 Other seasonal allergic rhinitis: Secondary | ICD-10-CM | POA: Diagnosis not present

## 2018-08-10 DIAGNOSIS — R062 Wheezing: Secondary | ICD-10-CM | POA: Diagnosis not present

## 2018-08-10 DIAGNOSIS — F17200 Nicotine dependence, unspecified, uncomplicated: Secondary | ICD-10-CM

## 2018-08-10 MED ORDER — FLUTICASONE PROPIONATE 50 MCG/ACT NA SUSP: 2.0000 | Freq: Every day | NASAL | 6 refills | Status: DC

## 2018-08-10 MED ORDER — PREDNISONE 20 MG PO TABS: ORAL_TABLET | ORAL | 0 refills | Status: DC

## 2018-08-10 MED ORDER — ALBUTEROL SULFATE HFA 108 (90 BASE) MCG/ACT IN AERS
2.0000 | INHALATION_SPRAY | Freq: Four times a day (QID) | RESPIRATORY_TRACT | 0 refills | Status: DC | PRN
Start: 2018-08-10 — End: 2018-09-14

## 2018-08-10 MED ORDER — HYDROCODONE-HOMATROPINE 5-1.5 MG/5ML PO SYRP: 5.0000 mL | ORAL_SOLUTION | Freq: Three times a day (TID) | ORAL | 0 refills | Status: DC | PRN

## 2018-08-10 MED ORDER — LEVOCETIRIZINE DIHYDROCHLORIDE 5 MG PO TABS: ORAL_TABLET | ORAL | 3 refills | Status: DC

## 2018-08-10 NOTE — Progress Notes (Signed)
Subjective:    Patient ID: Manuel Carey, male    DOB: Feb 05, 1971, 48 y.o.   MRN: [id number]  HPI 08/2017 Patient is a very pleasant 48 year old Caucasian male here today to establish care.  Past medical history is significant for a non-ST elevation myocardial infarction in 2015.  Patient underwent cardiac catheterization at that time and had a Xience drug-eluting stent placed in the proximal LAD.  He is followed by cardiology, Dr. Sherril Croon.  He continues to remain on dual antiplatelet therapy.  He is also on a combination of metoprolol and high-dose Lipitor.  Unfortunately he continues to smoke about 1/2 pack of cigarettes a day.  He is try to quit twice both previous times with Chantix.  Patient even has a starter pack for Chantix at home but has yet to start back on the medication.  He is interested in quitting.  He is also interested in weight loss.  Patient reached his peak weight of 350 pounds recently.  He started cutting back on his diet and is now down to 339 pounds.  He is not engaging in any regular aerobic exercise.  Patient has not been evaluated for obstructive sleep apnea.  He has a very large neck circumference with a short mandible.  However he denies any snoring, apnea episodes, or hypersomnolence.  However the patient also lives alone.  At that time, my plan was: Patient has 2 large risk factors that we spent the majority of time discussing today.  First is his tobacco abuse.  I recommended that he start back on Chantix immediately and then continue Chantix for as long as needed to facilitate smoking cessation.  Patient will consider this.  His morbid obesity is concerning as well.  I recommended less than 1500 -calorie/day diet.  I have recommended gradually increasing aerobic exercise up to a goal of 30 minutes a day 5 days a week.  I have also recommended starting the patient on Saxenda and uptitrating the patient to 3 mg a day to help facilitate weight loss.  We also discussed screening  for obstructive sleep apnea but he declines this at the present time.  We will begin by focusing on weight loss through diet, lifestyle changes, and medication.  Recheck in 3 months on medication to reassess weight loss.  We also tentatively discussed gastric bypass however the patient is against pursuing that option at this time  12/09/17 Patient is here today for complete physical exam.  Patient states that he has been dealing with postnasal drainage, head congestion, and wheezing and coughing now for more than a month.  He denies any fever.  He denies any purulent sputum.  He denies any head pain or sinus pain.  He does report postnasal drip, thick phlegm in his throat that is worse when he is lying down at night, a cough productive of white sputum.  He is also audibly wheezing on his exam today.  Unfortunately the patient continues to smoke.  His insurance would not cover any weight loss medication.  He is still hesitant to consider gastric bypass.  Otherwise he is doing well with no concerns.  His blood pressure is well controlled today at 126/70.  He is not engaging in any specific diet or any specific exercise program.  At that time, my plan was: I believe the patient has sinusitis causing bronchospasm and bronchitis.  I am also concerned that the wheezing may represent underlying mild COPD.  Continue to encourage smoking cessation.  Meanwhile treat  the patient with a prednisone taper pack due to the bronchospasm and wheezing, Flonase 2 sprays each nostril daily and Xyzal 5 mg daily for postnasal drip and sinus congestion.  Reassess in 2 weeks if no better or sooner if worse.  Regarding his overall health, I believe the patient has 3 large risk factors.  These risk factors are his weight, his smoking and tobacco abuse, and the third is I suspect obstructive sleep apnea based on his body habitus, neck circumference, and mandible length.  I believe these 3 factors increase his risk of cardiovascular  mortality the future.  I have encouraged therapeutic lifestyle changes including diet exercise and weight loss.  I have also recommended considering a gastric bypass.  He is hesitant to consider that at the present time.  I am encouraging him to quit smoking.  I have also recommended a referral for a sleep study to evaluate and treat possible underlying obstructive sleep apnea.  Patient will consider this in the future.  Meanwhile I will check a CBC, CMP, and a fasting lipid panel.  08/10/18 Patient has a 1 week history of itchy watery eyes, sneezing, head congestion, postnasal drip, wheezing, and nonproductive cough.  He denies any shortness of breath however he does have significant wheezing in all 4 lung fields bilaterally.  He denies any sinus pain or sinus pressure.  He denies any fevers or chills.  He is not taking any of his allergy medication that he was previously prescribed Past Medical History:  Diagnosis Date   Environmental allergies    Heart attack (HCC)    2015, DES x 1, xience stent proximal LAD   HTN (hypertension)    Hyperlipemia    Prediabetes    Past Surgical History:  Procedure Laterality Date   KNEE ARTHROSCOPY W/ MENISCECTOMY     left knee   LEFT HEART CATHETERIZATION WITH CORONARY ANGIOGRAM N/A 12/06/2013   Procedure: LEFT HEART CATHETERIZATION WITH CORONARY ANGIOGRAM;  Surgeon: [name], MD;  Location: [facility] CATH LAB;  Service: Cardiovascular;  Laterality: N/A;   PERCUTANEOUS STENT INTERVENTION  12/06/2013   Procedure: PERCUTANEOUS STENT INTERVENTION;  Surgeon: [name], MD;  Location: [facility] CATH LAB;  Service: Cardiovascular;;   Current Outpatient Medications on File Prior to Visit  Medication Sig Dispense Refill   aspirin 81 MG chewable tablet Chew 1 tablet (81 mg total) by mouth daily.     atorvastatin (LIPITOR) 80 MG tablet Take 1 tablet (80 mg total) by mouth daily at 6 PM. 30 tablet 3   clopidogrel (PLAVIX) 75 MG tablet Take 1 tablet (75 mg  total) by mouth daily. 90 tablet 3   fluticasone (FLONASE) 50 MCG/ACT nasal spray Place 2 sprays into both nostrils daily. 16 g 6   levocetirizine (XYZAL) 5 MG tablet TAKE 1 TABLET BY MOUTH EVERY DAY IN THE EVENING 90 tablet 3   metoprolol tartrate (LOPRESSOR) 25 MG tablet Take 1 tablet (25 mg total) by mouth 2 (two) times daily. 60 tablet 1   predniSONE (DELTASONE) 20 MG tablet 3 tabs poqday 1-2, 2 tabs poqday 3-4, 1 tab poqday 5-6 12 tablet 0   No current facility-administered medications on file prior to visit.    No Known Allergies Social History   Socioeconomic History   Marital status: Single    Spouse name: Not on file   Number of children: Not on file   Years of education: Not on file   Highest education level: Not on file  Occupational History  Not on file  Social Needs   Financial resource strain: Not on file   Food insecurity:    Worry: Not on file    Inability: Not on file   Transportation needs:    Medical: Not on file    Non-medical: Not on file  Tobacco Use   Smoking status: Current Every Day Smoker    Packs/day: 0.50    Years: 25.00    Pack years: 12.50    Types: Cigarettes   Smokeless tobacco: Never Used  Substance and Sexual Activity   Alcohol use: Not Currently   Drug use: Never   Sexual activity: Not on file  Lifestyle   Physical activity:    Days per week: Not on file    Minutes per session: Not on file   Stress: Not on file  Relationships   Social connections:    Talks on phone: Not on file    Gets together: Not on file    Attends religious service: Not on file    Active member of club or organization: Not on file    Attends meetings of clubs or organizations: Not on file    Relationship status: Not on file   Intimate partner violence:    Fear of current or ex partner: Not on file    Emotionally abused: Not on file    Physically abused: Not on file    Forced sexual activity: Not on file  Other Topics Concern   Not  on file  Social History Narrative   Not on file   Family History  Problem Relation Age of Onset   Cancer Mother        breast   Diabetes Father    Hyperlipidemia Father    Hypertension Father      Review of Systems  All other systems reviewed and are negative.      Objective:   Physical Exam Vitals signs reviewed.  Constitutional:      General: He is not in acute distress.    Appearance: He is well-developed. He is not diaphoretic.  HENT:     Head: Normocephalic and atraumatic.     Right Ear: Tympanic membrane and ear canal normal.     Left Ear: Tympanic membrane and ear canal normal.     Nose: Congestion and rhinorrhea present.  Eyes:     General:        Right eye: No discharge.     Conjunctiva/sclera: Conjunctivae normal.     Pupils: Pupils are equal, round, and reactive to light.  Neck:     Thyroid: No thyromegaly.     Vascular: No JVD.  Cardiovascular:     Rate and Rhythm: Normal rate and regular rhythm.     Heart sounds: Normal heart sounds. No murmur. No friction rub. No gallop.   Pulmonary:     Effort: Pulmonary effort is normal. No respiratory distress.     Breath sounds: No stridor. Wheezing present. No rales.  Lymphadenopathy:     Cervical: No cervical adenopathy.           Assessment & Plan:  I believe the patient's allergies have caused sinusitis and bronchospasm.  I recommended a prednisone taper pack to initially gain control particular given his bronchospasm.  He can use albuterol 2 puffs inhaled every 4-6 hours as needed for wheezing.  Then begin Flonase 2 sprays each nostril daily and Xyzal 5 mg daily to help control his allergies to prevent this in the future.  He can use Hycodan 1 teaspoon every 6 hours as needed for cough until symptoms are better

## 2018-09-10 ENCOUNTER — Other Ambulatory Visit: Payer: Self-pay | Admitting: Cardiology

## 2018-09-11 ENCOUNTER — Other Ambulatory Visit: Payer: Self-pay | Admitting: *Deleted

## 2018-09-11 MED ORDER — LEVOCETIRIZINE DIHYDROCHLORIDE 5 MG PO TABS: ORAL_TABLET | ORAL | 3 refills | Status: DC

## 2018-09-14 ENCOUNTER — Other Ambulatory Visit: Payer: Self-pay | Admitting: Family Medicine

## 2018-11-24 ENCOUNTER — Other Ambulatory Visit: Payer: Self-pay

## 2018-11-24 MED ORDER — METOPROLOL TARTRATE 25 MG PO TABS: 25.0000 mg | ORAL_TABLET | Freq: Two times a day (BID) | ORAL | 1 refills | Status: DC

## 2018-11-30 ENCOUNTER — Other Ambulatory Visit: Payer: Self-pay

## 2018-11-30 ENCOUNTER — Emergency Department (HOSPITAL_COMMUNITY): Payer: 59

## 2018-11-30 ENCOUNTER — Emergency Department (HOSPITAL_COMMUNITY)
Admission: EM | Admit: 2018-11-30 | Discharge: 2018-11-30 | Disposition: A | Discharge status: Home / Self Care | Payer: 59 | Attending: Emergency Medicine | Admitting: Emergency Medicine

## 2018-11-30 ENCOUNTER — Encounter (HOSPITAL_COMMUNITY): Payer: Self-pay | Admitting: Emergency Medicine

## 2018-11-30 ENCOUNTER — Telehealth: Payer: Self-pay

## 2018-11-30 DIAGNOSIS — I2510 Atherosclerotic heart disease of native coronary artery without angina pectoris: Secondary | ICD-10-CM | POA: Insufficient documentation

## 2018-11-30 DIAGNOSIS — Z720 Tobacco use: Secondary | ICD-10-CM

## 2018-11-30 DIAGNOSIS — R0789 Other chest pain: Secondary | ICD-10-CM | POA: Diagnosis present

## 2018-11-30 DIAGNOSIS — R062 Wheezing: Secondary | ICD-10-CM | POA: Diagnosis not present

## 2018-11-30 DIAGNOSIS — I10 Essential (primary) hypertension: Secondary | ICD-10-CM | POA: Insufficient documentation

## 2018-11-30 DIAGNOSIS — Z79899 Other long term (current) drug therapy: Secondary | ICD-10-CM | POA: Insufficient documentation

## 2018-11-30 DIAGNOSIS — R600 Localized edema: Secondary | ICD-10-CM | POA: Diagnosis not present

## 2018-11-30 DIAGNOSIS — Z7982 Long term (current) use of aspirin: Secondary | ICD-10-CM | POA: Diagnosis not present

## 2018-11-30 DIAGNOSIS — R079 Chest pain, unspecified: Secondary | ICD-10-CM

## 2018-11-30 DIAGNOSIS — I252 Old myocardial infarction: Secondary | ICD-10-CM | POA: Diagnosis not present

## 2018-11-30 DIAGNOSIS — F17210 Nicotine dependence, cigarettes, uncomplicated: Secondary | ICD-10-CM | POA: Diagnosis not present

## 2018-11-30 LAB — BASIC METABOLIC PANEL
Anion gap: 8 (ref 5–15)
BUN: 7 mg/dL (ref 6–20)
CO2: 25 mmol/L (ref 22–32)
Calcium: 8.5 mg/dL — ABNORMAL LOW (ref 8.9–10.3)
Chloride: 107 mmol/L (ref 98–111)
Creatinine, Ser: 0.9 mg/dL (ref 0.61–1.24)
GFR calc Af Amer: >60 mL/min (ref >60)
GFR calc non Af Amer: >60 mL/min (ref >60)
Glucose, Bld: 127 mg/dL — ABNORMAL HIGH (ref 70–99)
Potassium: 4 mmol/L (ref 3.5–5.1)
Sodium: 140 mmol/L (ref 135–145)

## 2018-11-30 LAB — CBC
HCT: 42.6 % (ref 39.0–52.0)
Hemoglobin: 13.9 g/dL (ref 13.0–17.0)
MCH: 30.3 pg (ref 26.0–34.0)
MCHC: 32.6 g/dL (ref 30.0–36.0)
MCV: 93 fL (ref 80.0–100.0)
Platelets: 193 10*3/uL (ref 150–400)
RBC: 4.58 MIL/uL (ref 4.22–5.81)
RDW: 13.1 % (ref 11.5–15.5)
WBC: 9.5 10*3/uL (ref 4.0–10.5)
nRBC: 0 % (ref 0.0–0.2)

## 2018-11-30 LAB — TROPONIN I (HIGH SENSITIVITY)
Troponin I (High Sensitivity): 5 ng/L (ref <18)
Troponin I (High Sensitivity): 6 ng/L (ref <18)

## 2018-11-30 MED ORDER — NITROGLYCERIN 0.4 MG SL SUBL: 0.4000 mg | SUBLINGUAL_TABLET | SUBLINGUAL | 3 refills | Status: DC | PRN

## 2018-11-30 MED ORDER — PREDNISONE 10 MG (21) PO TBPK: ORAL_TABLET | ORAL | 0 refills | Status: DC

## 2018-11-30 MED ORDER — AEROCHAMBER Z-STAT PLUS/MEDIUM MISC
1.0000 | Freq: Once | Status: DC
  Filled 2018-11-30: qty 1

## 2018-11-30 MED ORDER — NITROGLYCERIN 0.4 MG SL SUBL
0.4000 mg | SUBLINGUAL_TABLET | SUBLINGUAL | Status: DC | PRN
  Administered 2018-11-30: 0.4 mg via SUBLINGUAL
  Filled 2018-11-30: qty 1

## 2018-11-30 MED ORDER — HYDROCODONE-ACETAMINOPHEN 5-325 MG PO TABS: 1.0000 | ORAL_TABLET | ORAL | 0 refills | Status: DC | PRN

## 2018-11-30 MED ORDER — METHYLPREDNISOLONE SODIUM SUCC 125 MG IJ SOLR
125.0000 mg | Freq: Once | INTRAMUSCULAR | Status: AC
  Administered 2018-11-30: 125 mg via INTRAVENOUS
  Filled 2018-11-30: qty 2

## 2018-11-30 MED ORDER — ALBUTEROL SULFATE HFA 108 (90 BASE) MCG/ACT IN AERS
4.0000 | INHALATION_SPRAY | Freq: Once | RESPIRATORY_TRACT | Status: AC
  Administered 2018-11-30: 4 via RESPIRATORY_TRACT
  Filled 2018-11-30: qty 6.7

## 2018-11-30 MED ORDER — AEROCHAMBER PLUS FLO-VU LARGE MISC: 1.0000 | Freq: Once | Status: DC

## 2018-11-30 NOTE — ED Triage Notes (Signed)
Pt states he woke up with left sided CP. Pt also complains of SOB today and lower leg swelling that started over the past few days.

## 2018-11-30 NOTE — Telephone Encounter (Signed)
Pt called he has been having some chest pains this morning when he got out the shower; He took a asa and his regular meds it still hurts; He says he got winded just walking into a building  [phone]

## 2018-11-30 NOTE — Telephone Encounter (Signed)
Ok I will send in nitro and he also says that it feels like a constant cramp and he is also having swelling in both feet

## 2018-11-30 NOTE — Discharge Instructions (Signed)
Try to stop smoking. °

## 2018-11-30 NOTE — ED Notes (Signed)
Patient Alert and oriented to baseline. Stable and ambulatory to baseline. Patient verbalized understanding of the discharge instructions.  Patient belongings were taken by the patient.   

## 2018-11-30 NOTE — ED Provider Notes (Signed)
[facility] Provider Note   CSN: [id number] Arrival date & time: 11/30/18  [id number]    History   Chief Complaint Chief Complaint  Patient presents with  . Chest Pain    HPI Manuel Carey is a 48 y.o. male.     Pt presents to the ED today with cp and sob with exertion.  Pt said cp started this morning.  Sob has been going on for the last few days.  He took his usual meds and an asa.  He called his cardiologist who recommended him coming to the ED.  Pt has a hx of CAD and NSTEMI requiring stent placement in 2015.  He is morbidly obese, smokes, has htn, hyperlipidemia and prediabetes.  CP hurts on the left lower part of his chest and hurts with movement or when he tries to sit up.     Past Medical History:  Diagnosis Date  . Environmental allergies   . Heart attack (HCC)    2015, DES x 1, xience stent proximal LAD  . HTN (hypertension)   . Hyperlipemia   . Prediabetes     Patient Active Problem List   Diagnosis Date Noted  . Prediabetes   . Heart attack (HCC)   . NSTEMI (non-ST elevated myocardial infarction) (HCC) 12/04/2013    Past Surgical History:  Procedure Laterality Date  . KNEE ARTHROSCOPY W/ MENISCECTOMY     left knee  . LEFT HEART CATHETERIZATION WITH CORONARY ANGIOGRAM N/A 12/06/2013   Procedure: LEFT HEART CATHETERIZATION WITH CORONARY ANGIOGRAM;  Surgeon: Pamella PertJagadeesh R Ganji, MD;  [facility];  Service: Cardiovascular;  Laterality: N/A;  . PERCUTANEOUS STENT INTERVENTION  12/06/2013   Procedure: PERCUTANEOUS STENT INTERVENTION;  Surgeon: Pamella PertJagadeesh R Ganji, MD;  [facility];  Service: Cardiovascular;;        Home Medications    Prior to Admission medications   Medication Sig Start Date End Date Taking? Authorizing Provider  aspirin 81 MG chewable tablet Chew 1 tablet (81 mg total) by mouth daily. 12/07/13  Yes Yates DecampGanji, Jay, MD  atorvastatin (LIPITOR) 80 MG tablet TAKE 1 TABLET BY MOUTH  DAILY  Patient taking differently: Take 80 mg by mouth daily at 6 PM.  09/10/18  Yes Yates DecampGanji, Jay, MD  fluticasone (FLONASE) 50 MCG/ACT nasal spray Place 2 sprays into both nostrils daily. Patient taking differently: Place 2 sprays into both nostrils as needed for allergies.  08/10/18  Yes Donita BrooksPickard, Warren T, MD  levocetirizine (XYZAL) 5 MG tablet TAKE 1 TABLET BY MOUTH EVERY DAY IN THE EVENING Patient taking differently: Take 5 mg by mouth every evening. TAKE 1 TABLET BY MOUTH EVERY DAY IN THE EVENING 09/11/18  Yes Donita BrooksPickard, Warren T, MD  metoprolol tartrate (LOPRESSOR) 25 MG tablet Take 1 tablet (25 mg total) by mouth 2 (two) times daily. Patient taking differently: Take 12.5 mg by mouth 2 (two) times daily.  11/24/18  Yes Earl ManyVyas, Chandra K, MD  nitroGLYCERIN (NITROSTAT) 0.4 MG SL tablet Place 1 tablet (0.4 mg total) under the tongue every 5 (five) minutes as needed for chest pain. 11/30/18 02/28/19 Yes Toniann FailKelley, Ashton Haynes, NP  VENTOLIN HFA 108 (90 Base) MCG/ACT inhaler TAKE 2 PUFFS BY MOUTH EVERY 6 HOURS AS NEEDED FOR WHEEZE OR SHORTNESS OF BREATH Patient taking differently: Inhale 2 puffs into the lungs every 4 (four) hours as needed for wheezing or shortness of breath.  09/14/18  Yes Donita BrooksPickard, Warren T, MD  HYDROcodone-acetaminophen (NORCO/VICODIN) 5-325 MG tablet  Take 1 tablet by mouth every 4 (four) hours as needed. 11/30/18   Jacalyn Lefevre, MD  HYDROcodone-homatropine [facility]) 5-1.5 MG/5ML syrup Take 5 mLs by mouth every 8 (eight) hours as needed for cough. Patient not taking: Reported on 11/30/2018 08/10/18   Donita Brooks, MD  predniSONE (STERAPRED UNI-PAK 21 TAB) 10 MG (21) TBPK tablet Take 6 tabs for 2 days, then 5 for 2 days, then 4 for 2 days, then 3 for 2 days, 2 for 2 days, then 1 for 2 days 11/30/18   Jacalyn Lefevre, MD    Family History Family History  Problem Relation Age of Onset  . Cancer Mother        breast  . Diabetes Father   . Hyperlipidemia Father   . Hypertension Father      Social History Social History   Tobacco Use  . Smoking status: Current Every Day Smoker    Packs/day: 0.50    Years: 25.00    Pack years: 12.50    Types: Cigarettes  . Smokeless tobacco: Never Used  Substance Use Topics  . Alcohol use: Not Currently  . Drug use: Never     Allergies   Patient has no known allergies.   Review of Systems Review of Systems  Cardiovascular: Positive for chest pain.  All other systems reviewed and are negative.    Physical Exam Updated Vital Signs BP (!) 133/108   Pulse (!) 52   Temp 98.9 F (37.2 C) (Oral)   Resp 14   Ht 5\' 9"  (1.753 m)   Wt (!) 154.2 kg   SpO2 96%   BMI 50.21 kg/m   Physical Exam Vitals signs and nursing note reviewed.  Constitutional:      Appearance: He is well-developed. He is obese.  HENT:     Head: Normocephalic and atraumatic.  Eyes:     Extraocular Movements: Extraocular movements intact.     Pupils: Pupils are equal, round, and reactive to light.  Neck:     Musculoskeletal: Normal range of motion and neck supple.  Cardiovascular:     Rate and Rhythm: Normal rate and regular rhythm.     Heart sounds: Normal heart sounds.  Pulmonary:     Effort: Pulmonary effort is normal.     Breath sounds: Wheezing present.  Abdominal:     General: Bowel sounds are normal.     Palpations: Abdomen is soft.  Musculoskeletal: Normal range of motion.     Right lower leg: Edema present.     Left lower leg: Edema present.  Skin:    General: Skin is warm and dry.     Capillary Refill: Capillary refill takes less than 2 seconds.  Neurological:     General: No focal deficit present.     Mental Status: He is alert and oriented to person, place, and time.  Psychiatric:        Mood and Affect: Mood normal.        Behavior: Behavior normal.      ED Treatments / Results  Labs (all labs ordered are listed, but only abnormal results are displayed) Labs Reviewed  BASIC METABOLIC PANEL - Abnormal; Notable for the  following components:      Result Value   Glucose, Bld 127 (*)    Calcium 8.5 (*)    All other components within normal limits  CBC  TROPONIN I (HIGH SENSITIVITY)  TROPONIN I (HIGH SENSITIVITY)    EKG EKG Interpretation  Date/Time:  Monday  November 30 2018 09:16:24 EDT Ventricular Rate:  57 PR Interval:  164 QRS Duration: 78 QT Interval:  432 QTC Calculation: 420 R Axis:   -2 Text Interpretation:  Sinus bradycardia Otherwise normal ECG No significant change since last tracing Confirmed by Isla Pence [phone]) on 11/30/2018 9:18:48 AM   Radiology Dg Chest Port 1 View  Result Date: 11/30/2018 CLINICAL DATA:  Left-sided chest pain this morning which shortness-of-breath 3 days. Bilateral feet swelling. Nasal drainage and wheezing. EXAM: PORTABLE CHEST 1 VIEW COMPARISON:  12/03/2013 FINDINGS: Lordotic technique is demonstrated as the lungs are adequately inflated and otherwise clear. Borderline cardiomegaly. Remainder of the exam is unchanged. IMPRESSION: No active disease. Electronically Signed   By: Marin Olp M.D.   On: 11/30/2018 09:27    Procedures Procedures (including critical care time)  Medications Ordered in ED Medications  nitroGLYCERIN (NITROSTAT) SL tablet 0.4 mg (0.4 mg Sublingual Given 11/30/18 0950)  AeroChamber Plus Flo-Vu Large MISC 1 each (1 each Other Not Given 11/30/18 0956)  methylPREDNISolone sodium succinate (SOLU-MEDROL) 125 mg/2 mL injection 125 mg (125 mg Intravenous Given 11/30/18 0950)  albuterol (VENTOLIN HFA) 108 (90 Base) MCG/ACT inhaler 4 puff (4 puffs Inhalation Given 11/30/18 0950)     Initial Impression / Assessment and Plan / ED Course  I have reviewed the triage vital signs and the nursing notes.  Pertinent labs & imaging results that were available during my care of the patient were reviewed by me and considered in my medical decision making (see chart for details).       Nitro did nothing for pt's pain.  Pain seems more pleuritic and  likely msk.  So although he has multiple risk factors, I think after a nl EKG and 2 normal troponins, he can go home.  CP likely from sob.  Pt is wheezing here which improved after solumedrol and albuterol.  He likely has undiagnosed COPD.  He is encouraged to stop smoking.  He knows he needs to stop.  He will be d/c home with prednisone and lortab.  Return if worse.  F/u with pcp.  Final Clinical Impressions(s) / ED Diagnoses   Final diagnoses:  Tobacco abuse  Atypical chest pain  Wheezing    ED Discharge Orders         Ordered    predniSONE (STERAPRED UNI-PAK 21 TAB) 10 MG (21) TBPK tablet     11/30/18 1226    HYDROcodone-acetaminophen (NORCO/VICODIN) 5-325 MG tablet  Every 4 hours PRN     11/30/18 1226           Isla Pence, MD 11/30/18 1228

## 2018-11-30 NOTE — Telephone Encounter (Signed)
Does he have any nitro? If not please send some in. If he is still having chest pain, probably should go to ER for eval.

## 2018-12-07 ENCOUNTER — Other Ambulatory Visit: Payer: Self-pay

## 2018-12-08 ENCOUNTER — Encounter: Payer: Self-pay | Admitting: Family Medicine

## 2018-12-08 ENCOUNTER — Ambulatory Visit
Admission: RE | Admit: 2018-12-08 | Discharge: 2018-12-08 | Disposition: A | Discharge status: Home / Self Care | Payer: 59 | Source: Ambulatory Visit | Attending: Family Medicine | Admitting: Family Medicine

## 2018-12-08 ENCOUNTER — Ambulatory Visit: Payer: 59 | Admitting: Family Medicine

## 2018-12-08 VITALS — BP 130/84 | HR 59 | Temp 98.5°F | Resp 16 | Ht 69.0 in | Wt 342.4 lb

## 2018-12-08 DIAGNOSIS — R600 Localized edema: Secondary | ICD-10-CM | POA: Diagnosis not present

## 2018-12-08 DIAGNOSIS — R079 Chest pain, unspecified: Secondary | ICD-10-CM

## 2018-12-08 DIAGNOSIS — Z09 Encounter for follow-up examination after completed treatment for conditions other than malignant neoplasm: Secondary | ICD-10-CM

## 2018-12-08 DIAGNOSIS — R1012 Left upper quadrant pain: Secondary | ICD-10-CM | POA: Diagnosis not present

## 2018-12-08 MED ORDER — PANTOPRAZOLE SODIUM 20 MG PO TBEC: 20.0000 mg | DELAYED_RELEASE_TABLET | Freq: Every day | ORAL | 1 refills | Status: DC

## 2018-12-08 MED ORDER — FUROSEMIDE 20 MG PO TABS: 20.0000 mg | ORAL_TABLET | Freq: Every day | ORAL | 1 refills | Status: DC | PRN

## 2018-12-08 MED ORDER — POTASSIUM CHLORIDE CRYS ER 20 MEQ PO TBCR: 20.0000 meq | EXTENDED_RELEASE_TABLET | Freq: Every day | ORAL | 1 refills | Status: DC

## 2018-12-08 NOTE — Patient Instructions (Addendum)
Take lasix and the potassium supplement for 3 days Avoid salty foods (see below)  When swelling in legs gets better, NOTE your weight - this will be your "dry weight" Call us Friday with update on swelling/weight/chest pain symptoms  Follow up with cardiology  Also start this protonix medicine in the morning on an empty stomach - at least 2 week trial   Low-Sodium Eating Plan Sodium, which is an element that makes up salt, helps you maintain a healthy balance of fluids in your body. Too much sodium can increase your blood pressure and cause fluid and waste to be held in your body. Your health care provider or dietitian may recommend following this plan if you have high blood pressure (hypertension), kidney disease, liver disease, or heart failure. Eating less sodium can help lower your blood pressure, reduce swelling, and protect your heart, liver, and kidneys. What are tips for following this plan? General guidelines  Most people on this plan should limit their sodium intake to 1,500-2,000 mg (milligrams) of sodium each day. Reading food labels   The Nutrition Facts label lists the amount of sodium in one serving of the food. If you eat more than one serving, you must multiply the listed amount of sodium by the number of servings.  Choose foods with less than 140 mg of sodium per serving.  Avoid foods with 300 mg of sodium or more per serving. Shopping  Look for lower-sodium products, often labeled as "low-sodium" or "no salt added."  Always check the sodium content even if foods are labeled as "unsalted" or "no salt added".  Buy fresh foods. ? Avoid canned foods and premade or frozen meals. ? Avoid canned, cured, or processed meats  Buy breads that have less than 80 mg of sodium per slice. Cooking  Eat more home-cooked food and less restaurant, buffet, and fast food.  Avoid adding salt when cooking. Use salt-free seasonings or herbs instead of table salt or sea salt. Check  with your health care provider or pharmacist before using salt substitutes.  Cook with plant-based oils, such as canola, sunflower, or olive oil. Meal planning  When eating at a restaurant, ask that your food be prepared with less salt or no salt, if possible.  Avoid foods that contain MSG (monosodium glutamate). MSG is sometimes added to [place] food, bouillon, and some canned foods. What foods are recommended? The items listed may not be a complete list. Talk with your dietitian about what dietary choices are best for you. Grains Low-sodium cereals, including oats, puffed wheat and rice, and shredded wheat. Low-sodium crackers. Unsalted rice. Unsalted pasta. Low-sodium bread. Whole-grain breads and whole-grain pasta. Vegetables Fresh or frozen vegetables. "No salt added" canned vegetables. "No salt added" tomato sauce and paste. Low-sodium or reduced-sodium tomato and vegetable juice. Fruits Fresh, frozen, or canned fruit. Fruit juice. Meats and other protein foods Fresh or frozen (no salt added) meat, poultry, seafood, and fish. Low-sodium canned tuna and salmon. Unsalted nuts. Dried peas, beans, and lentils without added salt. Unsalted canned beans. Eggs. Unsalted nut butters. Dairy Milk. Soy milk. Cheese that is naturally low in sodium, such as ricotta cheese, fresh mozzarella, or Swiss cheese Low-sodium or reduced-sodium cheese. Cream cheese. Yogurt. Fats and oils Unsalted butter. Unsalted margarine with no trans fat. Vegetable oils such as canola or olive oils. Seasonings and other foods Fresh and dried herbs and spices. Salt-free seasonings. Low-sodium mustard and ketchup. Sodium-free salad dressing. Sodium-free light mayonnaise. Fresh or refrigerated horseradish. Lemon juice. Vinegar.  Homemade, reduced-sodium, or low-sodium soups. Unsalted popcorn and pretzels. Low-salt or salt-free chips. What foods are not recommended? The items listed may not be a complete list. Talk with your  dietitian about what dietary choices are best for you. Grains Instant hot cereals. Bread stuffing, pancake, and biscuit mixes. Croutons. Seasoned rice or pasta mixes. Noodle soup cups. Boxed or frozen macaroni and cheese. Regular salted crackers. Self-rising flour. Vegetables Sauerkraut, pickled vegetables, and relishes. Olives. [place] fries. Onion rings. Regular canned vegetables (not low-sodium or reduced-sodium). Regular canned tomato sauce and paste (not low-sodium or reduced-sodium). Regular tomato and vegetable juice (not low-sodium or reduced-sodium). Frozen vegetables in sauces. Meats and other protein foods Meat or fish that is salted, canned, smoked, spiced, or pickled. Bacon, ham, sausage, hotdogs, corned beef, chipped beef, packaged lunch meats, salt pork, jerky, pickled herring, anchovies, regular canned tuna, sardines, salted nuts. Dairy Processed cheese and cheese spreads. Cheese curds. Blue cheese. Feta cheese. String cheese. Regular cottage cheese. Buttermilk. Canned milk. Fats and oils Salted butter. Regular margarine. Ghee. Bacon fat. Seasonings and other foods Onion salt, garlic salt, seasoned salt, table salt, and sea salt. Canned and packaged gravies. Worcestershire sauce. Tartar sauce. Barbecue sauce. Teriyaki sauce. Soy sauce, including reduced-sodium. Steak sauce. Fish sauce. Oyster sauce. Cocktail sauce. Horseradish that you find on the shelf. Regular ketchup and mustard. Meat flavorings and tenderizers. Bouillon cubes. Hot sauce and Tabasco sauce. Premade or packaged marinades. Premade or packaged taco seasonings. Relishes. Regular salad dressings. Salsa. Potato and tortilla chips. Corn chips and puffs. Salted popcorn and pretzels. Canned or dried soups. Pizza. Frozen entrees and pot pies. Summary  Eating less sodium can help lower your blood pressure, reduce swelling, and protect your heart, liver, and kidneys.  Most people on this plan should limit their sodium intake to  1,500-2,000 mg (milligrams) of sodium each day.  Canned, boxed, and frozen foods are high in sodium. Restaurant foods, fast foods, and pizza are also very high in sodium. You also get sodium by adding salt to food.  Try to cook at home, eat more fresh fruits and vegetables, and eat less fast food, canned, processed, or prepared foods. This information is not intended to replace advice given to you by your health care provider. Make sure you discuss any questions you have with your health care provider. Document Released: 10/26/2001 Document Revised: 04/18/2017 Document Reviewed: 04/29/2016 Elsevier Patient Education  2020 [employer].

## 2018-12-08 NOTE — Progress Notes (Signed)
Sorry Manuel Carey (used an old saved results quickaction)  Manuel Carey please see result note for pt

## 2018-12-08 NOTE — Progress Notes (Signed)
Patient ID: Manuel Carey, male    DOB: 07/08/1970, 48 y.o.   MRN: [id number]  PCP: Donita BrooksPickard, Warren T, MD  Chief Complaint  Patient presents with  . Chest Pain    x1 week, pain goes from chest to back, painful to bend, miralax and prednisone was taken    Subjective:   Manuel LappingMichael T Carey is a 48 y.o. male, presents to clinic with CC of pain to chest, epigastric and LUQ area with radiation to left back. Pain onset more than 1 week ago, comes and goes for a few minutes at a time, seems related to different positions or movement, feels sharp and cramping.  No pain with eating, deep breathing, coughing.  ER had CXR neg for infiltrate, pulmonary edema, but it did show cardiomegaly and it was portable 1 view He reports prior to onset of CP he had been eating a lot of salty food and weight jumped up about 5 lbs  Denies orthopnea, PND, palpitations, near syncope, diaphoresis Prior to CP had nasal sx, drainage, cough and wheeze - has been on steroids and using inhaler, getting better, now rare wheeze and SOB, cough is more productive, the pain he was feeling and still feels is not related to and seem associated with coughing, deep inspiration, and does not feel like sore chest wall/ribs, not reproducible with palpation and breathing. No pain with eating, denies gerd, reflux, N, V, D.  HE does feel backed up and has been trying to take stool softeners, thought maybe if he "cleared out" he wouldn't have the pain.      Patient Active Problem List   Diagnosis Date Noted  . Prediabetes   . Heart attack (HCC)   . NSTEMI (non-ST elevated myocardial infarction) (HCC) 12/04/2013     Prior to Admission medications   Medication Sig Start Date End Date Taking? Authorizing Provider  aspirin 81 MG chewable tablet Chew 1 tablet (81 mg total) by mouth daily. 12/07/13   Yates DecampGanji, Jay, MD  atorvastatin (LIPITOR) 80 MG tablet TAKE 1 TABLET BY MOUTH  DAILY Patient taking differently: Take 80 mg by mouth daily at 6  PM.  09/10/18   Yates DecampGanji, Jay, MD  fluticasone (FLONASE) 50 MCG/ACT nasal spray Place 2 sprays into both nostrils daily. Patient taking differently: Place 2 sprays into both nostrils as needed for allergies.  08/10/18   Donita BrooksPickard, Warren T, MD  HYDROcodone-acetaminophen (NORCO/VICODIN) 5-325 MG tablet Take 1 tablet by mouth every 4 (four) hours as needed. 11/30/18   Jacalyn LefevreHaviland, Julie, MD  levocetirizine (XYZAL) 5 MG tablet TAKE 1 TABLET BY MOUTH EVERY DAY IN THE EVENING Patient taking differently: Take 5 mg by mouth every evening. TAKE 1 TABLET BY MOUTH EVERY DAY IN THE EVENING 09/11/18   Donita BrooksPickard, Warren T, MD  metoprolol tartrate (LOPRESSOR) 25 MG tablet Take 1 tablet (25 mg total) by mouth 2 (two) times daily. Patient taking differently: Take 12.5 mg by mouth 2 (two) times daily.  11/24/18   Earl ManyVyas, Chandra K, MD  nitroGLYCERIN (NITROSTAT) 0.4 MG SL tablet Place 1 tablet (0.4 mg total) under the tongue every 5 (five) minutes as needed for chest pain. 11/30/18 02/28/19  Toniann FailKelley, Ashton Haynes, NP  predniSONE (STERAPRED UNI-PAK 21 TAB) 10 MG (21) TBPK tablet Take 6 tabs for 2 days, then 5 for 2 days, then 4 for 2 days, then 3 for 2 days, 2 for 2 days, then 1 for 2 days 11/30/18   Jacalyn LefevreHaviland, Julie, MD  VENTOLIN HFA 108 (  90 Base) MCG/ACT inhaler TAKE 2 PUFFS BY MOUTH EVERY 6 HOURS AS NEEDED FOR WHEEZE OR SHORTNESS OF BREATH Patient taking differently: Inhale 2 puffs into the lungs every 4 (four) hours as needed for wheezing or shortness of breath.  09/14/18   Susy Frizzle, MD     No Known Allergies   Family History  Problem Relation Age of Onset  . Cancer Mother        breast  . Diabetes Father   . Hyperlipidemia Father   . Hypertension Father      Social History   Socioeconomic History  . Marital status: Single    Spouse name: Not on file  . Number of children: Not on file  . Years of education: Not on file  . Highest education level: Not on file  Occupational History  . Not on file  Social  Needs  . Financial resource strain: Not on file  . Food insecurity    Worry: Not on file    Inability: Not on file  . Transportation needs    Medical: Not on file    Non-medical: Not on file  Tobacco Use  . Smoking status: Current Every Day Smoker    Packs/day: 0.50    Years: 25.00    Pack years: 12.50    Types: Cigarettes  . Smokeless tobacco: Never Used  Substance and Sexual Activity  . Alcohol use: Not Currently  . Drug use: Never  . Sexual activity: Not on file  Lifestyle  . Physical activity    Days per week: Not on file    Minutes per session: Not on file  . Stress: Not on file  Relationships  . Social Herbalist on phone: Not on file    Gets together: Not on file    Attends religious service: Not on file    Active member of club or organization: Not on file    Attends meetings of clubs or organizations: Not on file    Relationship status: Not on file  . Intimate partner violence    Fear of current or ex partner: Not on file    Emotionally abused: Not on file    Physically abused: Not on file    Forced sexual activity: Not on file  Other Topics Concern  . Not on file  Social History Narrative  . Not on file     Review of Systems  Constitutional: Negative.  Negative for appetite change.  HENT: Negative.   Eyes: Negative.   Respiratory: Positive for cough.   Cardiovascular: Positive for chest pain and leg swelling. Negative for palpitations.  Gastrointestinal: Positive for abdominal pain and constipation. Negative for abdominal distention, anal bleeding, blood in stool, diarrhea, nausea, rectal pain and vomiting.  Endocrine: Negative.   Genitourinary: Negative.   Musculoskeletal: Negative.   Skin: Negative.  Negative for color change.  Allergic/Immunologic: Negative.   Neurological: Negative.   Hematological: Negative.   Psychiatric/Behavioral: Negative.   All other systems reviewed and are negative.      Objective:    Vitals:    12/08/18 1007  BP: 130/84  Pulse: (!) 59  Resp: 16  Temp: 98.5 F (36.9 C)  TempSrc: Oral  SpO2: 97%  Weight: (!) 342 lb 6.4 oz (155.3 kg)  Height: 5\' 9"  (1.753 m)      Physical Exam Vitals signs and nursing note reviewed.  Constitutional:      General: He is not in acute distress.  Appearance: Normal appearance. He is well-developed. He is obese. He is not ill-appearing, toxic-appearing or diaphoretic.  HENT:     Head: Normocephalic and atraumatic.     Jaw: No trismus.     Right Ear: Tympanic membrane, ear canal and external ear normal.     Left Ear: Tympanic membrane, ear canal and external ear normal.     Nose: No mucosal edema or rhinorrhea.     Right Sinus: No maxillary sinus tenderness or frontal sinus tenderness.     Left Sinus: No maxillary sinus tenderness or frontal sinus tenderness.     Mouth/Throat:     Pharynx: Uvula midline. No oropharyngeal exudate, posterior oropharyngeal erythema or uvula swelling.  Eyes:     General: Lids are normal.     Conjunctiva/sclera: Conjunctivae normal.     Pupils: Pupils are equal, round, and reactive to light.  Neck:     Musculoskeletal: Normal range of motion and neck supple.     Trachea: Trachea and phonation normal. No tracheal deviation.  Cardiovascular:     Rate and Rhythm: Regular rhythm. Bradycardia present.  No extrasystoles are present.    Chest Wall: PMI is not displaced. No thrill.     Pulses: Normal pulses.          Radial pulses are 2+ on the right side and 2+ on the left side.     Heart sounds: Normal heart sounds. Heart sounds not distant. No murmur. No friction rub. No gallop.   Pulmonary:     Effort: Pulmonary effort is normal. No tachypnea, accessory muscle usage or respiratory distress.     Breath sounds: Normal breath sounds. No stridor. No decreased breath sounds, wheezing, rhonchi or rales.  Abdominal:     General: Abdomen is protuberant. Bowel sounds are normal. There is no distension or abdominal  bruit.     Palpations: Abdomen is soft.     Tenderness: There is no abdominal tenderness. There is no right CVA tenderness, left CVA tenderness, guarding or rebound. Negative signs include Murphy's sign.  Musculoskeletal: Normal range of motion.     Right lower leg: 2+ Pitting Edema present.     Left lower leg: 2+ Pitting Edema present.  Skin:    General: Skin is warm and dry.     Capillary Refill: Capillary refill takes less than 2 seconds.     Coloration: Skin is not cyanotic or pale.     Findings: No rash.     Nails: There is no clubbing.   Neurological:     Mental Status: He is alert.     Gait: Gait normal.  Psychiatric:        Speech: Speech normal.        Behavior: Behavior normal.     EKG:  Sinus Bradycardia, HR 55, left axis deviation, no ST elevation or depression, good R wave progression.  No change from 11/30/2018 EKG   Wt Readings from Last 5 Encounters:  12/08/18 (!) 342 lb 6.4 oz (155.3 kg)  11/30/18 (!) 340 lb (154.2 kg)  08/10/18 (!) 331 lb (150.1 kg)  12/09/17 (!) 332 lb (150.6 kg)  11/10/17 (!) 328 lb 9.6 oz (149.1 kg)        Assessment & Plan:       ICD-10-CM   1. Chest pain, unspecified type  R07.9 EKG 12-Lead    COMPLETE METABOLIC PANEL WITH GFR    Brain natriuretic peptide    Lipase    DG Abd 1 View  Ambulatory referral to Cardiology    DG Chest 2 View   seen in ED records reviewed, CP continued positional? not exertional, not related to eating and not pleuritic?  No chest wall tenderness.  He did have preceding URI sx and productive cough and treated for bronchitis (?) with steroids, URI sx and cough improving but today pain the same, low center to left chest and LUQ radiating to back, intermittent, sharp, not associated with diaphoresis, near syncope, SOB, palpitations Repeat CXR with 2 view to better assess costophrenic angle, add KUB as well 2+ pitting edema, weight up ~5 lbs, appears hypervolemic on exam, may be some CHF causing sx?  BNP  obtained today.  EKG today unchanged Tx and recommendations - DASH diet, 2-3 days lasix + K+, watch and monitor for "dry weight" pt educated on low salt diet, fluid monitoring, dry weight etc, encouraged to f/up with his cardiologist for further eval, possibly consisting of ECHO Hx of CAD with stent and last cath ~ 5 years ago, neg high sensitivity trop in ED, EKG unremarkable, the given history of CP not suggestive of ACS, but defer to cardiology for further work up   2. Bilateral lower extremity edema  R60.0 Brain natriuretic peptide  (see above)  furosemide (LASIX) 20 MG tablet    potassium chloride SA (K-DUR) 20 MEQ tablet    Ambulatory referral to Cardiology    DG Chest 2 View  3. Left upper quadrant pain  R10.12 COMPLETE METABOLIC PANEL WITH GFR    Lipase    DG Abd 1 View    pantoprazole (PROTONIX) 20 MG tablet    DG Chest 2 View   no abd ttp, slightly odd presentation, KUB to assess gas/bowel pattern, continue miralax tx possible constipation, add PPI trial x 2 weeks to cover possible upper GI etiology (gastritis, gastric ulcer/peptic ulcer), CMP and lipase to assess liver and pancreatic enzymes - in location of pancreas but hx not c/w pancreatitis.  Pain most related to positions - still may be pleuricy? Effusion? Pericardial effusion?  GI, Pancreatic, renal or gallbladder?  4. Encounter for examination following treatment at hospital  Z09    ER visit, documentation, results, imaging and EKG reviewed       Danelle BerryLeisa Gianfranco Araki, PA-C 12/08/18 10:31 AM

## 2018-12-09 LAB — COMPLETE METABOLIC PANEL WITH GFR
AG Ratio: 2.1 (calc) (ref 1.0–2.5)
ALT: 29 U/L (ref 9–46)
AST: 16 U/L (ref 10–40)
Albumin: 3.9 g/dL (ref 3.6–5.1)
Alkaline phosphatase (APISO): 86 U/L (ref 36–130)
BUN: 13 mg/dL (ref 7–25)
CO2: 28 mmol/L (ref 20–32)
Calcium: 8.9 mg/dL (ref 8.6–10.3)
Chloride: 104 mmol/L (ref 98–110)
Creat: 0.78 mg/dL (ref 0.60–1.35)
GFR, Est African American: 123 mL/min/{1.73_m2} (ref >=60)
GFR, Est Non African American: 106 mL/min/{1.73_m2} (ref >=60)
Globulin: 1.9 g/dL (calc) (ref 1.9–3.7)
Glucose, Bld: 126 mg/dL — ABNORMAL HIGH (ref 65–99)
Potassium: 3.8 mmol/L (ref 3.5–5.3)
Sodium: 141 mmol/L (ref 135–146)
Total Bilirubin: 0.6 mg/dL (ref 0.2–1.2)
Total Protein: 5.8 g/dL — ABNORMAL LOW (ref 6.1–8.1)

## 2018-12-09 LAB — BRAIN NATRIURETIC PEPTIDE: Brain Natriuretic Peptide: 38 pg/mL (ref <100)

## 2018-12-09 LAB — LIPASE: Lipase: 9 U/L (ref 7–60)

## 2018-12-10 ENCOUNTER — Ambulatory Visit: Payer: 59 | Admitting: Cardiology

## 2018-12-10 ENCOUNTER — Encounter: Payer: Self-pay | Admitting: Cardiology

## 2018-12-10 ENCOUNTER — Ambulatory Visit: Payer: 59 | Admitting: Family Medicine

## 2018-12-10 ENCOUNTER — Other Ambulatory Visit: Payer: Self-pay

## 2018-12-10 VITALS — BP 136/72 | HR 71 | Temp 98.4°F | Wt 338.0 lb

## 2018-12-10 DIAGNOSIS — R0789 Other chest pain: Secondary | ICD-10-CM

## 2018-12-10 DIAGNOSIS — Z720 Tobacco use: Secondary | ICD-10-CM

## 2018-12-10 DIAGNOSIS — R0609 Other forms of dyspnea: Secondary | ICD-10-CM

## 2018-12-10 DIAGNOSIS — I2510 Atherosclerotic heart disease of native coronary artery without angina pectoris: Secondary | ICD-10-CM | POA: Diagnosis not present

## 2018-12-10 DIAGNOSIS — R600 Localized edema: Secondary | ICD-10-CM

## 2018-12-10 DIAGNOSIS — R0600 Dyspnea, unspecified: Secondary | ICD-10-CM

## 2018-12-10 NOTE — Progress Notes (Signed)
Primary Physician:  Susy Frizzle, MD   Patient ID: Manuel Carey, male    DOB: [birth year], 48 y.o.   MRN: [id number]  Subjective:    Chief Complaint  Patient presents with  . Chest Pain  . Edema    HPI: Manuel Carey  is a 48 y.o. male  with hypertension, hyperlipidemia, ongoing tobacco use, prediabetes,  CAD and NSTEMI on 12/03/2013 S/P 3 x 33 mm Xience DES proximal LAD on 12/06/2013. Mild disease in other vessels and ectasia in the distal RCA. LVEF- 60%. Last seen in Jan 2020  We were asked to see the patient on urgent basis.  Chest pain is been present for the last few weeks. He can make chest pain occur by moving a certain away and resolves with changing positions. Mostly notices at night with lying in bed, but does have a few episode that radiates through to his back. Mostly chest pain is left lower part of his chest. He has had shortness of breath, leg swelling, and productive cough.  Prior to chest pain he had URI symptoms and was treated with steroids and inhaler.  Shortness of breath has improved some. BNP was performed and was within normal limits.  He was seen in the emergency room for the same, chest x-ray was negative for infiltrate or pulmonary edema.  Troponin's were negative. He was noted to have cardiomegaly.  No EKG changes suggestive of ischemia.  He does admit to gaining weight over the last few months. Plavix was discontinued at his last office visit.  Past Medical History:  Diagnosis Date  . Environmental allergies   . Heart attack ([place])    2015, DES x 1, xience stent proximal LAD  . HTN (hypertension)   . Hyperlipemia   . Prediabetes     Past Surgical History:  Procedure Laterality Date  . KNEE ARTHROSCOPY W/ MENISCECTOMY     left knee  . LEFT HEART CATHETERIZATION WITH CORONARY ANGIOGRAM N/A 12/06/2013   Procedure: LEFT HEART CATHETERIZATION WITH CORONARY ANGIOGRAM;  Surgeon: Laverda Page, MD;  Location: [facility] CATH LAB;  Service: Cardiovascular;   Laterality: N/A;  . PERCUTANEOUS STENT INTERVENTION  12/06/2013   Procedure: PERCUTANEOUS STENT INTERVENTION;  Surgeon: Laverda Page, MD;  Location: [facility] CATH LAB;  Service: Cardiovascular;;    Social History   Socioeconomic History  . Marital status: Single    Spouse name: Not on file  . Number of children: 0  . Years of education: Not on file  . Highest education level: Not on file  Occupational History  . Not on file  Social Needs  . Financial resource strain: Not on file  . Food insecurity    Worry: Not on file    Inability: Not on file  . Transportation needs    Medical: Not on file    Non-medical: Not on file  Tobacco Use  . Smoking status: Current Every Day Smoker    Packs/day: 0.50    Years: 25.00    Pack years: 12.50    Types: Cigarettes  . Smokeless tobacco: Never Used  Substance and Sexual Activity  . Alcohol use: Not Currently  . Drug use: Never  . Sexual activity: Not on file  Lifestyle  . Physical activity    Days per week: Not on file    Minutes per session: Not on file  . Stress: Not on file  Relationships  . Social connections    Talks on phone: Not on file  Gets together: Not on file    Attends religious service: Not on file    Active member of club or organization: Not on file    Attends meetings of clubs or organizations: Not on file    Relationship status: Not on file  . Intimate partner violence    Fear of current or ex partner: Not on file    Emotionally abused: Not on file    Physically abused: Not on file    Forced sexual activity: Not on file  Other Topics Concern  . Not on file  Social History Narrative  . Not on file    Review of Systems  Constitution: Positive for weight gain. Negative for decreased appetite, malaise/fatigue and weight loss.  Eyes: Negative for visual disturbance.  Cardiovascular: Positive for chest pain and leg swelling. Negative for claudication, dyspnea on exertion, orthopnea, palpitations and syncope.   Respiratory: Negative for hemoptysis and wheezing.   Endocrine: Negative for cold intolerance and heat intolerance.  Hematologic/Lymphatic: Does not bruise/bleed easily.  Skin: Negative for nail changes.  Musculoskeletal: Negative for muscle weakness and myalgias.  Gastrointestinal: Negative for abdominal pain, change in bowel habit, nausea and vomiting.  Neurological: Negative for difficulty with concentration, dizziness, focal weakness and headaches.  Psychiatric/Behavioral: Negative for altered mental status and suicidal ideas.  All other systems reviewed and are negative.     Objective:  Blood pressure 136/72, pulse 71, temperature 98.4 F (36.9 C), weight (!) 338 lb (153.3 kg), SpO2 97 %. Body mass index is 49.91 kg/m.    Physical Exam  Constitutional: He is oriented to person, place, and time. Vital signs are normal. He appears well-developed and well-nourished.  Morbidly obese  HENT:  Head: Normocephalic and atraumatic.  Neck: Normal range of motion.  Cardiovascular: Normal rate, regular rhythm, normal heart sounds and intact distal pulses.  Pulmonary/Chest: Effort normal and breath sounds normal. No accessory muscle usage. No respiratory distress.  Abdominal: Soft. Bowel sounds are normal.  Musculoskeletal: Normal range of motion.  Neurological: He is alert and oriented to person, place, and time.  Skin: Skin is warm and dry.  Vitals reviewed.  Radiology: No results found.  Laboratory examination:    CMP Latest Ref Rng & Units 12/08/2018 11/30/2018 12/09/2017  Glucose 65 - 99 mg/dL 126(H) 127(H) 119(H)  BUN 7 - 25 mg/dL _0 Creatinine 0.60 - 1.35 mg/dL 0.78 0.90 0.85  Sodium 135 - 146 mmol/L 141 140 144  Potassium 3.5 - 5.3 mmol/L 3.8 4.0 4.3  Chloride 98 - 110 mmol/L 104 107 107  CO2 20 - 32 mmol/L _1 Calcium 8.6 - 10.3 mg/dL 8.9 8.5(L) 8.5(L)  Total Protein 6.1 - 8.1 g/dL 5.8(L) - 5.9(L)  Total Bilirubin 0.2 - 1.2 mg/dL 0.6 - 0.5  Alkaline Phos 39 -  117 U/L - - -  AST 10 - 40 U/L 16 - 17  ALT 9 - 46 U/L 29 - 22   CBC Latest Ref Rng & Units 11/30/2018 12/09/2017 12/07/2013  WBC 4.0 - 10.5 K/uL 9.5 9.0 10.2  Hemoglobin 13.0 - 17.0 g/dL 13.9 14.0 13.0  Hematocrit 39.0 - 52.0 % 42.6 41.2 39.4  Platelets 150 - 400 K/uL 193 220 177   Lipid Panel     Component Value Date/Time   CHOL 91 12/09/2017 0843   TRIG 130 12/09/2017 0843   HDL 30 (L) 12/09/2017 0843   CHOLHDL 3.0 12/09/2017 0843   VLDL 36 12/04/2013 0310   LDLCALC 40  12/09/2017 0843   HEMOGLOBIN A1C Lab Results  Component Value Date   HGBA1C 5.7 (H) 12/04/2013   MPG 117 (H) 12/04/2013   TSH No results for input(s): TSH in the last 8760 hours.  PRN Meds:. There are no discontinued medications. Current Meds  Medication Sig  . aspirin 81 MG chewable tablet Chew 1 tablet (81 mg total) by mouth daily.  Marland Kitchen atorvastatin (LIPITOR) 80 MG tablet TAKE 1 TABLET BY MOUTH  DAILY (Patient taking differently: Take 80 mg by mouth daily at 6 PM. )  . fluticasone (FLONASE) 50 MCG/ACT nasal spray Place 2 sprays into both nostrils daily. (Patient taking differently: Place 2 sprays into both nostrils as needed for allergies. )  . furosemide (LASIX) 20 MG tablet Take 1 tablet (20 mg total) by mouth daily as needed for fluid or edema.  Marland Kitchen HYDROcodone-acetaminophen (NORCO/VICODIN) 5-325 MG tablet Take 1 tablet by mouth every 4 (four) hours as needed.  Marland Kitchen levocetirizine (XYZAL) 5 MG tablet TAKE 1 TABLET BY MOUTH EVERY DAY IN THE EVENING (Patient taking differently: Take 5 mg by mouth every evening. TAKE 1 TABLET BY MOUTH EVERY DAY IN THE EVENING)  . metoprolol tartrate (LOPRESSOR) 25 MG tablet Take 1 tablet (25 mg total) by mouth 2 (two) times daily. (Patient taking differently: Take 12.5 mg by mouth 2 (two) times daily. )  . nitroGLYCERIN (NITROSTAT) 0.4 MG SL tablet Place 1 tablet (0.4 mg total) under the tongue every 5 (five) minutes as needed for chest pain.  . pantoprazole (PROTONIX) 20 MG tablet  Take 1 tablet (20 mg total) by mouth daily.  . potassium chloride SA (K-DUR) 20 MEQ tablet Take 1 tablet (20 mEq total) by mouth daily. When taking lasix prn  . predniSONE (STERAPRED UNI-PAK 21 TAB) 10 MG (21) TBPK tablet Take 6 tabs for 2 days, then 5 for 2 days, then 4 for 2 days, then 3 for 2 days, 2 for 2 days, then 1 for 2 days  . VENTOLIN HFA 108 (90 Base) MCG/ACT inhaler TAKE 2 PUFFS BY MOUTH EVERY 6 HOURS AS NEEDED FOR WHEEZE OR SHORTNESS OF BREATH (Patient taking differently: Inhale 2 puffs into the lungs every 4 (four) hours as needed for wheezing or shortness of breath. )    Cardiac Studies:   Coronary Angiogram 12/03/2013: S/P 3 x 33 mm Xience DES proximal LAD on 12/06/2013. Mild disease in other vessels and mild ectasion in distal RCA.  Treadmill stress test [12/24/2013]: Indications: Assessment of Chest Pain. Conclusions: Negative for ischemia. Continue primary prevention The patient exercised according to the Bruce protocol, Total time recorded 9 Min. 0 sec. achieving a max heart rate of 164 which was 93% of MPHR for age and 10.0 METS of work. Baseline NIBP was 122/74. Peak NIBP was 122/74 MaxSysp was: 122 MaxDiasp was: 74.The baseline ECG showed NSR,LAD, LAFB. During exercise there was no ST-T changes of ischemia. Symptoms: THR met. Mild dyspnea. Arrhythmia: Rare PAC  Assessment:     ICD-10-CM   1. Musculoskeletal chest pain  R07.89 EKG 12-Lead  2. Atherosclerosis of native coronary artery of native heart without angina pectoris  I25.10   3. Dyspnea on exertion  R06.09 PCV ECHOCARDIOGRAM COMPLETE  4. Bilateral leg edema  R60.0 PCV ECHOCARDIOGRAM COMPLETE  5. Morbidly obese (HCC)  E66.01   6. Tobacco use  Z72.0     EKG 12/10/2018: Normal sinus rhythm at 67 bpm, left axis deviation, inferolateral T wave flattening, cannot exclude ischemia. No significant changes compared to previous  EKG.  Recommendations:   For the last few weeks, patient has been having chest pain occurring  several times throughout the day.  Chest pain can be exacerbated by certain position and is relieved by certain positions. Nitroglycerin does not help. His symptoms are fairly suggestive of musculoskeletal etiology. I have encouraged him to try Iburprofen and heat/ice to his chest wall.  He does have occasional episodes that radiate through to his back.  I continue to feel that his chest pain is musculoskeletal etiology.  He has had negative troponins in the emergency room.  EKG is unchanged.  He has dyspnea on exertion that has slightly worsened along with newly noted leg edema that improves with Lasix.  He has 2+ pitting edema on exam today.  I will schedule him for echocardiogram for further evaluation.  Has history of hypertension that is well controlled.  No PND or orthopnea.  I suspect his shortness of breath and leg edema is multifactorial, diastolic dysfunction, obesity, and ongoing tobacco use.  I have encouraged him to make significant diet changes with limiting salt and portion control.  I have encouraged him to lose 10 pounds before he comes back to see [place] to help with the symptoms as well. He had previously lost 25 lbs earlier this year.  I have also recommended that for the next few days he stay at home to elevate his legs to help with his leg swelling, work note was provided for this.  I will see him back in 4 weeks for follow-up after his echocardiogram.  Miquel Dunn, MSN, APRN, FNP-C [facility]. [place] Office: [phone] Fax: [phone]

## 2018-12-13 ENCOUNTER — Encounter: Payer: Self-pay | Admitting: Cardiology

## 2018-12-14 ENCOUNTER — Telehealth: Payer: Self-pay

## 2018-12-14 NOTE — Telephone Encounter (Signed)
Morning! Patient called said [employer] put him out of work from 7/23 to 7/26 but they r not accepting the short letter given to him bc it is not detailed enough. I see AK last note, states to be off work. Can any of you please call this patient, or how can AK type a letter?. I believe he was to return to work today. Thank you.   Message copied-sent from [facility]. Please advise.//ah

## 2018-12-14 NOTE — Telephone Encounter (Signed)
Pt aware, please prepare letter and call pt when ready.//ah

## 2018-12-14 NOTE — Telephone Encounter (Signed)
Patient seen by Binnie Kand, NP in our office, who is currently away. I have reviewed patient's chart in detail. Given ongoing episdoes of chest pain, he is recommended to undergo cardiac workup, which is scheduled this week. Recommend time away from work this week until further workup is over.  Please put this in a letter.  Thanks MJP

## 2018-12-16 ENCOUNTER — Other Ambulatory Visit: Payer: Self-pay

## 2018-12-16 ENCOUNTER — Ambulatory Visit (INDEPENDENT_AMBULATORY_CARE_PROVIDER_SITE_OTHER): Payer: 59

## 2018-12-16 DIAGNOSIS — R600 Localized edema: Secondary | ICD-10-CM

## 2018-12-16 DIAGNOSIS — R0600 Dyspnea, unspecified: Secondary | ICD-10-CM

## 2018-12-16 DIAGNOSIS — R0609 Other forms of dyspnea: Secondary | ICD-10-CM | POA: Diagnosis not present

## 2018-12-21 ENCOUNTER — Encounter: Payer: Self-pay | Admitting: Cardiology

## 2018-12-21 NOTE — Telephone Encounter (Signed)
Pt calling back wanting to know when he can return to work and about work letter we were supposed to prepare for him.//ah

## 2018-12-21 NOTE — Telephone Encounter (Signed)
I thought they gave him a letter when he left the other day. He can return back to work as I do not suspect  cardiac etiology. Is he feeling better?

## 2018-12-21 NOTE — Telephone Encounter (Signed)
They letter was not acceptable for the job per what pt told LC.  We need to produce another one.//ah

## 2018-12-22 ENCOUNTER — Other Ambulatory Visit: Payer: Self-pay

## 2018-12-22 MED ORDER — METOPROLOL TARTRATE 25 MG PO TABS: 25.0000 mg | ORAL_TABLET | Freq: Two times a day (BID) | ORAL | 2 refills | Status: DC

## 2019-01-08 ENCOUNTER — Ambulatory Visit: Payer: 59 | Admitting: Cardiology

## 2019-03-30 ENCOUNTER — Ambulatory Visit: Payer: 59 | Admitting: Family Medicine

## 2019-03-30 ENCOUNTER — Other Ambulatory Visit: Payer: Self-pay

## 2019-03-30 ENCOUNTER — Encounter: Payer: Self-pay | Admitting: Family Medicine

## 2019-03-30 VITALS — BP 118/78 | HR 70 | Temp 97.8°F | Resp 20 | Ht 69.0 in | Wt 342.0 lb

## 2019-03-30 DIAGNOSIS — Z7189 Other specified counseling: Secondary | ICD-10-CM | POA: Diagnosis not present

## 2019-03-30 DIAGNOSIS — I2510 Atherosclerotic heart disease of native coronary artery without angina pectoris: Secondary | ICD-10-CM | POA: Insufficient documentation

## 2019-03-30 NOTE — Progress Notes (Signed)
   Subjective:    Patient ID: Manuel Carey, male    DOB: Jan 12, 1971, 48 y.o.   MRN: [id number]  Patient presents for Needs work note   Pt works for the [employer], he drives trucks, currently picking leaves Is a new mandate with his job stating that 2 people need to ride together in the truck although he is the only one operating the truck.  He does not feel comfortable with his comorbidities in the setting of COVID-19 and wants to appropriately social distance.  He needs a letter provided to his job stating that he has high risk comorbidities so that he can operate his truck alone.  He does not have any current symptoms of COVID-19.  He is already scheduled for his wellness visit with his PCP next week.  On review of his chart he has coronary artery disease he is status post stent in 2015 he has hypertension morbid obesity he also has what looks like reactive airways which is currently being worked up he has albuterol on hand and he takes allergy medications.    Review Of Systems:  GEN- denies fatigue, fever, weight loss,weakness, recent illness HEENT- denies eye drainage, change in vision, nasal discharge, CVS- denies chest pain, palpitations RESP- denies SOB, cough, wheeze ABD- denies N/V, change in stools, abd pain Neuro- denies headache, dizziness, syncope, seizure activity       Objective:    BP 118/78   Pulse 70   Temp 97.8 F (36.6 C) (Oral)   Resp 20   Ht 5\' 9"  (1.753 m)   Wt (!) 342 lb (155.1 kg)   SpO2 98%   BMI 50.50 kg/m  GEN- NAD, alert and oriented x3,obese  CVS- RRR, no murmur RESP-CTAB         Assessment & Plan:      Problem List Items Addressed This Visit      Unprioritized   CAD (coronary artery disease)    Other Visit Diagnoses    Counseled about COVID-19 virus infection    -  Primary   Pt High risk for covid infection complications, recommend he socially distant but be able to work. Provided letter today for his job stating his high  risk disease states      Note: This dictation was prepared with Diplomatic Services operational officer dictation along with smaller phrase technology. Any transcriptional errors that result from this process are unintentional.

## 2019-03-30 NOTE — Patient Instructions (Signed)
F/U as previous Dr. Dennard Schaumann

## 2019-04-06 ENCOUNTER — Encounter: Payer: Self-pay | Admitting: Family Medicine

## 2019-04-06 ENCOUNTER — Other Ambulatory Visit: Payer: Self-pay

## 2019-04-06 ENCOUNTER — Ambulatory Visit (INDEPENDENT_AMBULATORY_CARE_PROVIDER_SITE_OTHER): Payer: 59 | Admitting: Family Medicine

## 2019-04-06 VITALS — BP 120/72 | HR 64 | Temp 97.8°F | Resp 18 | Ht 69.0 in | Wt 337.0 lb

## 2019-04-06 DIAGNOSIS — Z0000 Encounter for general adult medical examination without abnormal findings: Secondary | ICD-10-CM

## 2019-04-06 DIAGNOSIS — F17200 Nicotine dependence, unspecified, uncomplicated: Secondary | ICD-10-CM

## 2019-04-06 DIAGNOSIS — Z0001 Encounter for general adult medical examination with abnormal findings: Secondary | ICD-10-CM | POA: Diagnosis not present

## 2019-04-06 DIAGNOSIS — G4710 Hypersomnia, unspecified: Secondary | ICD-10-CM

## 2019-04-06 DIAGNOSIS — R062 Wheezing: Secondary | ICD-10-CM

## 2019-04-06 DIAGNOSIS — I252 Old myocardial infarction: Secondary | ICD-10-CM

## 2019-04-06 DIAGNOSIS — I2510 Atherosclerotic heart disease of native coronary artery without angina pectoris: Secondary | ICD-10-CM

## 2019-04-06 MED ORDER — PREDNISONE 20 MG PO TABS: ORAL_TABLET | ORAL | 0 refills | Status: DC

## 2019-04-06 NOTE — Progress Notes (Signed)
Subjective:    Patient ID: Manuel Carey, male    DOB: Jun 10, 1970, 48 y.o.   MRN: [id number]  HPI 08/2017 Patient is a very pleasant 48 year old Caucasian male here today to establish care.  Past medical history is significant for a non-ST elevation myocardial infarction in 2015.  Patient underwent cardiac catheterization at that time and had a Xience drug-eluting stent placed in the proximal LAD.  He is followed by cardiology, Dr. Sherril Croon.  He continues to remain on dual antiplatelet therapy.  He is also on a combination of metoprolol and high-dose Lipitor.  Unfortunately he continues to smoke about 1/2 pack of cigarettes a day.  He is try to quit twice both previous times with Chantix.  Patient even has a starter pack for Chantix at home but has yet to start back on the medication.  He is interested in quitting.  He is also interested in weight loss.  Patient reached his peak weight of 350 pounds recently.  He started cutting back on his diet and is now down to 339 pounds.  He is not engaging in any regular aerobic exercise.  Patient has not been evaluated for obstructive sleep apnea.  He has a very large neck circumference with a short mandible.  However he denies any snoring, apnea episodes, or hypersomnolence.  However the patient also lives alone.  At that time, my plan was: Patient has 2 large risk factors that we spent the majority of time discussing today.  First is his tobacco abuse.  I recommended that he start back on Chantix immediately and then continue Chantix for as long as needed to facilitate smoking cessation.  Patient will consider this.  His morbid obesity is concerning as well.  I recommended less than 1500 -calorie/day diet.  I have recommended gradually increasing aerobic exercise up to a goal of 30 minutes a day 5 days a week.  I have also recommended starting the patient on Saxenda and uptitrating the patient to 3 mg a day to help facilitate weight loss.  We also discussed screening  for obstructive sleep apnea but he declines this at the present time.  We will begin by focusing on weight loss through diet, lifestyle changes, and medication.  Recheck in 3 months on medication to reassess weight loss.  We also tentatively discussed gastric bypass however the patient is against pursuing that option at this time  12/09/17 Patient is here today for complete physical exam.  Patient states that he has been dealing with postnasal drainage, head congestion, and wheezing and coughing now for more than a month.  He denies any fever.  He denies any purulent sputum.  He denies any head pain or sinus pain.  He does report postnasal drip, thick phlegm in his throat that is worse when he is lying down at night, a cough productive of white sputum.  He is also audibly wheezing on his exam today.  Unfortunately the patient continues to smoke.  His insurance would not cover any weight loss medication.  He is still hesitant to consider gastric bypass.  Otherwise he is doing well with no concerns.  His blood pressure is well controlled today at 126/70.  He is not engaging in any specific diet or any specific exercise program  04/06/19 Patient is here today for complete physical exam.  There is no family history of prostate cancer and he denies any lower urinary tract symptoms.  Therefore he does not need prostate cancer screening until age 27.  He had a colonoscopy initially in 2004 which was normal.  He is not due for colon cancer screening until next year.  He denies any melena or hematochezia.  He does agree that he finally wants to schedule the sleep study.  He does report hypersomnolence.  He is morbidly obese.  He has a very large neck circumference.  He has a very edematous palate and a large uvula which almost completely obscures his airway.  I believe the patient is extremely high risk for obstructive sleep apnea and likely has.  I certainly agree with sending him for possible sleep study.  Patient  continues to smoke.  Today on his physical exam he is audibly wheezing in all 4 lung fields.  He also reports some mild shortness of breath with activity.  He attributes this to congestion in his chest due to "allergies".  He denies any fevers or chills or purulent sputum.  His flu shot is up-to-date.  He believes is been less than 10 years since his last tetanus shot.  He continues to smoke and has no desire to quit Past Medical History:  Diagnosis Date  . Environmental allergies   . Heart attack ([place])    2015, DES x 1, xience stent proximal LAD  . HTN (hypertension)   . Hyperlipemia   . Prediabetes    Past Surgical History:  Procedure Laterality Date  . KNEE ARTHROSCOPY W/ MENISCECTOMY     left knee  . LEFT HEART CATHETERIZATION WITH CORONARY ANGIOGRAM N/A 12/06/2013   Procedure: LEFT HEART CATHETERIZATION WITH CORONARY ANGIOGRAM;  Surgeon: Laverda Page, MD;  Location: [facility] CATH LAB;  Service: Cardiovascular;  Laterality: N/A;  . PERCUTANEOUS STENT INTERVENTION  12/06/2013   Procedure: PERCUTANEOUS STENT INTERVENTION;  Surgeon: Laverda Page, MD;  Location: [facility] CATH LAB;  Service: Cardiovascular;;   Current Outpatient Medications on File Prior to Visit  Medication Sig Dispense Refill  . aspirin 81 MG chewable tablet Chew 1 tablet (81 mg total) by mouth daily.    Marland Kitchen atorvastatin (LIPITOR) 80 MG tablet TAKE 1 TABLET BY MOUTH  DAILY (Patient taking differently: Take 80 mg by mouth daily at 6 PM. ) 90 tablet 3  . fluticasone (FLONASE) 50 MCG/ACT nasal spray Place 2 sprays into both nostrils daily. (Patient taking differently: Place 2 sprays into both nostrils as needed for allergies. ) 16 g 6  . levocetirizine (XYZAL) 5 MG tablet TAKE 1 TABLET BY MOUTH EVERY DAY IN THE EVENING (Patient taking differently: Take 5 mg by mouth every evening. TAKE 1 TABLET BY MOUTH EVERY DAY IN THE EVENING) 90 tablet 3  . metoprolol tartrate (LOPRESSOR) 25 MG tablet Take 1 tablet (25 mg total) by mouth 2 (two)  times daily. 180 tablet 2  . nitroGLYCERIN (NITROSTAT) 0.4 MG SL tablet Place 1 tablet (0.4 mg total) under the tongue every 5 (five) minutes as needed for chest pain. 90 tablet 3  . VENTOLIN HFA 108 (90 Base) MCG/ACT inhaler TAKE 2 PUFFS BY MOUTH EVERY 6 HOURS AS NEEDED FOR WHEEZE OR SHORTNESS OF BREATH (Patient taking differently: Inhale 2 puffs into the lungs every 4 (four) hours as needed for wheezing or shortness of breath. ) 18 Inhaler 0  . furosemide (LASIX) 20 MG tablet Take 1 tablet (20 mg total) by mouth daily as needed for fluid or edema. (Patient not taking: Reported on 04/06/2019) 7 tablet 1  . potassium chloride SA (K-DUR) 20 MEQ tablet Take 1 tablet (20 mEq total) by mouth daily.  When taking lasix prn (Patient not taking: Reported on 04/06/2019) 7 tablet 1   No current facility-administered medications on file prior to visit.    No Known Allergies Social History   Socioeconomic History  . Marital status: Single    Spouse name: Not on file  . Number of children: 0  . Years of education: Not on file  . Highest education level: Not on file  Occupational History  . Not on file  Social Needs  . Financial resource strain: Not on file  . Food insecurity    Worry: Not on file    Inability: Not on file  . Transportation needs    Medical: Not on file    Non-medical: Not on file  Tobacco Use  . Smoking status: Current Every Day Smoker    Packs/day: 0.50    Years: 25.00    Pack years: 12.50    Types: Cigarettes  . Smokeless tobacco: Never Used  Substance and Sexual Activity  . Alcohol use: Not Currently  . Drug use: Never  . Sexual activity: Not on file  Lifestyle  . Physical activity    Days per week: Not on file    Minutes per session: Not on file  . Stress: Not on file  Relationships  . Social Musician on phone: Not on file    Gets together: Not on file    Attends religious service: Not on file    Active member of club or organization: Not on file     Attends meetings of clubs or organizations: Not on file    Relationship status: Not on file  . Intimate partner violence    Fear of current or ex partner: Not on file    Emotionally abused: Not on file    Physically abused: Not on file    Forced sexual activity: Not on file  Other Topics Concern  . Not on file  Social History Narrative  . Not on file   Family History  Problem Relation Age of Onset  . Cancer Mother        breast  . Diabetes Father   . Hyperlipidemia Father   . Hypertension Father      Review of Systems  All other systems reviewed and are negative.      Objective:   Physical Exam Vitals signs reviewed.  Constitutional:      General: He is not in acute distress.    Appearance: He is well-developed. He is obese. He is not ill-appearing, toxic-appearing or diaphoretic.  HENT:     Head: Normocephalic and atraumatic.     Right Ear: Tympanic membrane and ear canal normal.     Left Ear: Tympanic membrane and ear canal normal.     Nose: Congestion and rhinorrhea present.     Mouth/Throat:     Mouth: Mucous membranes are moist.     Pharynx: No oropharyngeal exudate or posterior oropharyngeal erythema.  Eyes:     General: No scleral icterus.       Right eye: No discharge.        Left eye: No discharge.     Conjunctiva/sclera: Conjunctivae normal.     Pupils: Pupils are equal, round, and reactive to light.  Neck:     Musculoskeletal: Neck supple. No neck rigidity or muscular tenderness.     Thyroid: No thyromegaly.     Vascular: No carotid bruit or JVD.  Cardiovascular:     Rate and Rhythm: Normal  rate and regular rhythm.     Heart sounds: Normal heart sounds. No murmur. No friction rub. No gallop.   Pulmonary:     Effort: Pulmonary effort is normal. No respiratory distress.     Breath sounds: No stridor. Wheezing present. No rhonchi or rales.  Chest:     Chest wall: No tenderness.  Abdominal:     General: Bowel sounds are normal. There is no  distension.     Palpations: Abdomen is soft. There is no mass.     Tenderness: There is no abdominal tenderness. There is no guarding or rebound.     Hernia: No hernia is present.  Musculoskeletal:     Right lower leg: No edema.     Left lower leg: No edema.  Lymphadenopathy:     Cervical: No cervical adenopathy.  Skin:    General: Skin is warm.     Coloration: Skin is not jaundiced or pale.     Findings: No bruising, erythema, lesion or rash.  Neurological:     General: No focal deficit present.     Mental Status: He is oriented to person, place, and time.     Cranial Nerves: No cranial nerve deficit.     Sensory: No sensory deficit.     Motor: No weakness.     Coordination: Coordination normal.     Gait: Gait normal.     Deep Tendon Reflexes: Reflexes normal.  Psychiatric:        Mood and Affect: Mood normal.        Behavior: Behavior normal.        Thought Content: Thought content normal.        Judgment: Judgment normal.           Assessment & Plan:  Hypersomnolence - Plan: Ambulatory referral to Sleep Studies  General medical exam - Plan: CBC with Differential, COMPLETE METABOLIC PANEL WITH GFR, Lipid Panel  Smoker unmotivated to quit  Wheezing  Coronary artery disease involving native coronary artery of native heart without angina pectoris  History of MI (myocardial infarction)  Patient appears to be having bronchospasms and wheezing secondary to allergies and smoking.  Recommended prednisone taper pack in addition to albuterol 2 puffs inhaled every 6 hours as needed.  Strongly, strongly, strongly encourage smoking cessation but the patient is precontemplative.  Colonoscopy is not due till next year along with prostate cancer screening.  Immunizations are up-to-date.  We will check a CBC, CMP, fasting lipid panel.  Goal LDL cholesterol is less than 70.  Blood pressure today is at goal at 120/72.  Strongly encouraged weight loss in addition to his smoking  cessation.  Meanwhile schedule the patient for a sleep study as I believe the patient is at high risk for obstructive sleep apnea.

## 2019-04-09 ENCOUNTER — Encounter: Payer: Self-pay | Admitting: Family Medicine

## 2019-04-09 DIAGNOSIS — E118 Type 2 diabetes mellitus with unspecified complications: Secondary | ICD-10-CM | POA: Insufficient documentation

## 2019-04-09 LAB — CBC WITH DIFFERENTIAL/PLATELET
Absolute Monocytes: 512 cells/uL (ref 200–950)
Basophils Absolute: 72 cells/uL (ref 0–200)
Basophils Relative: 0.9 %
Eosinophils Absolute: 152 cells/uL (ref 15–500)
Eosinophils Relative: 1.9 %
HCT: 41.8 % (ref 38.5–50.0)
Hemoglobin: 13.7 g/dL (ref 13.2–17.1)
Lymphs Abs: 2688 cells/uL (ref 850–3900)
MCH: 29.8 pg (ref 27.0–33.0)
MCHC: 32.8 g/dL (ref 32.0–36.0)
MCV: 91.1 fL (ref 80.0–100.0)
MPV: 9.1 fL (ref 7.5–12.5)
Monocytes Relative: 6.4 %
Neutro Abs: 4576 cells/uL (ref 1500–7800)
Neutrophils Relative %: 57.2 %
Platelets: 219 10*3/uL (ref 140–400)
RBC: 4.59 10*6/uL (ref 4.20–5.80)
RDW: 12.9 % (ref 11.0–15.0)
Total Lymphocyte: 33.6 %
WBC: 8 10*3/uL (ref 3.8–10.8)

## 2019-04-09 LAB — LIPID PANEL
Cholesterol: 107 mg/dL (ref <200)
HDL: 27 mg/dL — ABNORMAL LOW (ref >=40)
LDL Cholesterol (Calc): 59 mg/dL (calc)
Non-HDL Cholesterol (Calc): 80 mg/dL (calc) (ref <130)
Total CHOL/HDL Ratio: 4 (calc) (ref <5.0)
Triglycerides: 131 mg/dL (ref <150)

## 2019-04-09 LAB — COMPLETE METABOLIC PANEL WITH GFR
AG Ratio: 2.1 (calc) (ref 1.0–2.5)
ALT: 26 U/L (ref 9–46)
AST: 17 U/L (ref 10–40)
Albumin: 4 g/dL (ref 3.6–5.1)
Alkaline phosphatase (APISO): 95 U/L (ref 36–130)
BUN: 14 mg/dL (ref 7–25)
CO2: 25 mmol/L (ref 20–32)
Calcium: 9.1 mg/dL (ref 8.6–10.3)
Chloride: 103 mmol/L (ref 98–110)
Creat: 0.91 mg/dL (ref 0.60–1.35)
GFR, Est African American: 114 mL/min/{1.73_m2} (ref >=60)
GFR, Est Non African American: 99 mL/min/{1.73_m2} (ref >=60)
Globulin: 1.9 g/dL (calc) (ref 1.9–3.7)
Glucose, Bld: 126 mg/dL — ABNORMAL HIGH (ref 65–99)
Potassium: 4.5 mmol/L (ref 3.5–5.3)
Sodium: 139 mmol/L (ref 135–146)
Total Bilirubin: 0.7 mg/dL (ref 0.2–1.2)
Total Protein: 5.9 g/dL — ABNORMAL LOW (ref 6.1–8.1)

## 2019-04-09 LAB — TEST AUTHORIZATION

## 2019-04-09 LAB — HEMOGLOBIN A1C W/OUT EAG: Hgb A1c MFr Bld: 6.6 % of total Hgb — ABNORMAL HIGH (ref <5.7)

## 2019-07-06 ENCOUNTER — Other Ambulatory Visit: Payer: Self-pay | Admitting: Cardiology

## 2019-08-22 ENCOUNTER — Other Ambulatory Visit: Payer: Self-pay | Admitting: Cardiology

## 2019-10-13 ENCOUNTER — Other Ambulatory Visit: Payer: Self-pay | Admitting: Cardiology

## 2019-10-30 ENCOUNTER — Other Ambulatory Visit: Payer: Self-pay | Admitting: Family Medicine

## 2019-11-13 ENCOUNTER — Other Ambulatory Visit: Payer: Self-pay

## 2019-11-13 ENCOUNTER — Emergency Department (HOSPITAL_COMMUNITY): Payer: 59

## 2019-11-13 ENCOUNTER — Emergency Department (HOSPITAL_COMMUNITY)
Admission: EM | Admit: 2019-11-13 | Discharge: 2019-11-14 | Disposition: A | Discharge status: Home / Self Care | Payer: 59 | Attending: Emergency Medicine | Admitting: Emergency Medicine

## 2019-11-13 ENCOUNTER — Encounter (HOSPITAL_COMMUNITY): Payer: Self-pay | Admitting: *Deleted

## 2019-11-13 DIAGNOSIS — R079 Chest pain, unspecified: Secondary | ICD-10-CM | POA: Diagnosis not present

## 2019-11-13 DIAGNOSIS — Z7982 Long term (current) use of aspirin: Secondary | ICD-10-CM | POA: Insufficient documentation

## 2019-11-13 DIAGNOSIS — I2510 Atherosclerotic heart disease of native coronary artery without angina pectoris: Secondary | ICD-10-CM | POA: Insufficient documentation

## 2019-11-13 DIAGNOSIS — F17210 Nicotine dependence, cigarettes, uncomplicated: Secondary | ICD-10-CM | POA: Diagnosis not present

## 2019-11-13 DIAGNOSIS — Z955 Presence of coronary angioplasty implant and graft: Secondary | ICD-10-CM | POA: Insufficient documentation

## 2019-11-13 DIAGNOSIS — Z79899 Other long term (current) drug therapy: Secondary | ICD-10-CM | POA: Diagnosis not present

## 2019-11-13 DIAGNOSIS — I252 Old myocardial infarction: Secondary | ICD-10-CM | POA: Diagnosis not present

## 2019-11-13 DIAGNOSIS — E119 Type 2 diabetes mellitus without complications: Secondary | ICD-10-CM | POA: Diagnosis not present

## 2019-11-13 DIAGNOSIS — I10 Essential (primary) hypertension: Secondary | ICD-10-CM | POA: Insufficient documentation

## 2019-11-13 LAB — BASIC METABOLIC PANEL
Anion gap: 8 (ref 5–15)
BUN: 14 mg/dL (ref 6–20)
CO2: 24 mmol/L (ref 22–32)
Calcium: 8.8 mg/dL — ABNORMAL LOW (ref 8.9–10.3)
Chloride: 104 mmol/L (ref 98–111)
Creatinine, Ser: 0.93 mg/dL (ref 0.61–1.24)
GFR calc Af Amer: >60 mL/min (ref >60)
GFR calc non Af Amer: >60 mL/min (ref >60)
Glucose, Bld: 137 mg/dL — ABNORMAL HIGH (ref 70–99)
Potassium: 3.7 mmol/L (ref 3.5–5.1)
Sodium: 136 mmol/L (ref 135–145)

## 2019-11-13 LAB — CBC
HCT: 43.6 % (ref 39.0–52.0)
Hemoglobin: 14.3 g/dL (ref 13.0–17.0)
MCH: 29.7 pg (ref 26.0–34.0)
MCHC: 32.8 g/dL (ref 30.0–36.0)
MCV: 90.6 fL (ref 80.0–100.0)
Platelets: 205 10*3/uL (ref 150–400)
RBC: 4.81 MIL/uL (ref 4.22–5.81)
RDW: 12.9 % (ref 11.5–15.5)
WBC: 9.9 10*3/uL (ref 4.0–10.5)
nRBC: 0 % (ref 0.0–0.2)

## 2019-11-13 LAB — TROPONIN I (HIGH SENSITIVITY): Troponin I (High Sensitivity): 7 ng/L (ref <18)

## 2019-11-13 MED ORDER — SODIUM CHLORIDE 0.9% FLUSH: 3.0000 mL | Freq: Once | INTRAVENOUS | Status: DC

## 2019-11-13 NOTE — ED Triage Notes (Signed)
The pt arrived by gems from home chest pain all day with some sob nausea sob  Hx cabg  No chest pain now  Iv per ems

## 2019-11-14 ENCOUNTER — Other Ambulatory Visit: Payer: Self-pay

## 2019-11-14 LAB — TROPONIN I (HIGH SENSITIVITY): Troponin I (High Sensitivity): 7 ng/L (ref <18)

## 2019-11-14 NOTE — ED Provider Notes (Signed)
[facility] EMERGENCY DEPARTMENT Provider Note   CSN: [id number] Arrival date & time: 11/13/19  2117     History Chief Complaint  Patient presents with  . Chest Pain    Manuel Carey is a 49 y.o. male.  HPI Manuel Carey  is a 49 y.o. male  with hypertension, hyperlipidemia, ongoing tobacco use, prediabetes,  CAD and NSTEMI on 12/03/2013 S/P 3 x 33 mm Xience DES proximal LAD on 12/06/2013. Mild disease in other vessels and ectasia in the distal RCA. LVEF-55-60 %. Last seen in July 2020.  Patient states yesterday morning he began experiencing a very mild intermittent "pinching" central chest pain.  No modifying factors.  Typically lasts 4 to 10 minutes and will spontaneously alleviate.  He states throughout the day yesterday that his BP was significantly elevated in the 180s systolic. He states it was occurring every "few hours" throughout the day yesterday as well as last night.  He states it woke him up from sleep around 5 AM this morning.  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 He has not taken anything for his symptoms.  Has not taken any NTG.  Has not taken any of his regular medications for the last 24 hours. No chest pain currently.  He was reporting some very mild shortness of breath when this was occurring yesterday as well as some mild nausea.  He denies any symptoms at the moment. No n/v/d, syncope, dizziness, numbness, tingling.  Patient is still smoking 1/2 pack/day.     Past Medical History:  Diagnosis Date  . Diabetes mellitus type 2 with complications (HCC)   . Environmental allergies   . Heart attack (HCC)    2015, DES x 1, xience stent proximal LAD  . HTN (hypertension)   . Hyperlipemia     Patient Active Problem List   Diagnosis Date Noted  . Diabetes mellitus type 2 with complications (HCC)   . CAD (coronary artery disease) 03/30/2019  . Prediabetes   . Heart attack (HCC)   . NSTEMI (non-ST elevated  myocardial infarction) (HCC) 12/04/2013    Past Surgical History:  Procedure Laterality Date  . KNEE ARTHROSCOPY W/ MENISCECTOMY     left knee  . LEFT HEART CATHETERIZATION WITH CORONARY ANGIOGRAM N/A 12/06/2013   Procedure: LEFT HEART CATHETERIZATION WITH CORONARY ANGIOGRAM;  Surgeon: [name], MD;  Location: [facility] CATH LAB;  Service: Cardiovascular;  Laterality: N/A;  . PERCUTANEOUS STENT INTERVENTION  12/06/2013   Procedure: PERCUTANEOUS STENT INTERVENTION;  Surgeon: [name], MD;  Location: [facility] CATH LAB;  Service: Cardiovascular;;       Family History  Problem Relation Age of Onset  . Cancer Mother        breast  . Diabetes Father   . Hyperlipidemia Father   . Hypertension Father     Social History   Tobacco Use  . Smoking status: Current Every Day Smoker    Packs/day: 0.50    Years: 25.00    Pack years: 12.50    Types: Cigarettes  . Smokeless tobacco: Never Used  Vaping Use  . Vaping Use: Never used  Substance Use Topics  . Alcohol use: Not Currently  . Drug use: Never    Home Medications Prior to Admission medications   Medication Sig Start Date End Date Taking? Authorizing Provider  aspirin 81 MG chewable tablet Chew 1 tablet (81 mg total) by mouth daily. 12/07/13   Yates Decamp, MD  atorvastatin (LIPITOR) 80 MG tablet TAKE 1 TABLET BY MOUTH  DAILY 10/13/19   Yates Decamp, MD  fluticasone [facility]) 50 MCG/ACT nasal spray Place 2 sprays into both nostrils daily. Patient taking differently: Place 2 sprays into both nostrils as needed for allergies.  08/10/18   Donita Brooks, MD  furosemide (LASIX) 20 MG tablet Take 1 tablet (20 mg total) by mouth daily as needed for fluid or edema. Patient not taking: Reported on 04/06/2019 12/08/18   Danelle Berry, PA-C  levocetirizine (XYZAL) 5 MG tablet TAKE 1 TABLET BY MOUTH  DAILY IN THE EVENING 11/01/19   Donita Brooks, MD  metoprolol tartrate (LOPRESSOR) 25 MG tablet TAKE 1 TABLET BY MOUTH  TWICE DAILY Patient  taking differently: Take 12.5 mg by mouth 2 (two) times daily.  08/23/19   Yates Decamp, MD  nitroGLYCERIN (NITROSTAT) 0.4 MG SL tablet Place 1 tablet (0.4 mg total) under the tongue every 5 (five) minutes as needed for chest pain. 11/30/18 04/06/19  Toniann Fail, NP  potassium chloride SA (K-DUR) 20 MEQ tablet Take 1 tablet (20 mEq total) by mouth daily. When taking lasix prn Patient not taking: Reported on 04/06/2019 12/08/18   Danelle Berry, PA-C  predniSONE (DELTASONE) 20 MG tablet 3 tabs poqday 1-2, 2 tabs poqday 3-4, 1 tab poqday 5-6 04/06/19   Donita Brooks, MD  VENTOLIN HFA 108 (90 Base) MCG/ACT inhaler TAKE 2 PUFFS BY MOUTH EVERY 6 HOURS AS NEEDED FOR WHEEZE OR SHORTNESS OF BREATH Patient taking differently: Inhale 2 puffs into the lungs every 4 (four) hours as needed for wheezing or shortness of breath.  09/14/18   Donita Brooks, MD    Allergies    Patient has no known allergies.  Review of Systems   Review of Systems  All other systems reviewed and are negative. Ten systems reviewed and are negative for acute change, except as noted in the HPI.   Physical Exam Updated Vital Signs BP 133/78 (BP Location: Left Arm)   Pulse (!) 54   Temp 98 F (36.7 C) (Oral)   Resp 16   Ht 5\' 9"  (1.753 m)   Wt (!) 145.2 kg   SpO2 95%   BMI 47.26 kg/m   Physical Exam Vitals and nursing note reviewed.  Constitutional:      General: He is not in acute distress.    Appearance: Normal appearance. He is well-developed. He is obese. He is not ill-appearing, toxic-appearing or diaphoretic.  HENT:     Head: Normocephalic and atraumatic.     Right Ear: External ear normal.     Left Ear: External ear normal.     Nose: Nose normal.     Mouth/Throat:     Mouth: Mucous membranes are moist.     Pharynx: Oropharynx is clear. No oropharyngeal exudate or posterior oropharyngeal erythema.  Eyes:     Extraocular Movements: Extraocular movements intact.  Cardiovascular:     Rate and Rhythm:  Regular rhythm. Bradycardia present.     Pulses: Normal pulses.          Radial pulses are 2+ on the right side and 2+ on the left side.       Dorsalis pedis pulses are 2+ on the right side and 2+ on the left side.     Heart sounds: Normal heart sounds. Heart sounds not distant. No murmur heard.  No systolic murmur is present.  No friction rub. No gallop.      Comments: Mild  bradycardia.  Pulse in the high 50s while in the room.  No anterior chest wall pain. Pulmonary:     Effort: Pulmonary effort is normal. No tachypnea, accessory muscle usage or respiratory distress.     Breath sounds: Normal breath sounds. No stridor. No decreased breath sounds, wheezing, rhonchi or rales.  Abdominal:     General: Abdomen is flat.     Tenderness: There is no abdominal tenderness.  Musculoskeletal:        General: Normal range of motion.     Cervical back: Normal range of motion and neck supple. No tenderness.     Right lower leg: Edema present.     Left lower leg: Edema present.     Comments: Trace nonpitting edema noted in the lower extremities.  Skin:    General: Skin is warm and dry.  Neurological:     General: No focal deficit present.     Mental Status: He is alert and oriented to person, place, and time.  Psychiatric:        Mood and Affect: Mood normal.        Behavior: Behavior normal.    ED Results / Procedures / Treatments   Labs (all labs ordered are listed, but only abnormal results are displayed) Labs Reviewed  BASIC METABOLIC PANEL - Abnormal; Notable for the following components:      Result Value   Glucose, Bld 137 (*)    Calcium 8.8 (*)    All other components within normal limits  CBC  TROPONIN I (HIGH SENSITIVITY)  TROPONIN I (HIGH SENSITIVITY)  TROPONIN I (HIGH SENSITIVITY)   EKG EKG Interpretation  Date/Time:  Sunday November 14 2019 08:16:18 EDT Ventricular Rate:  53 PR Interval:    QRS Duration: 100 QT Interval:  468 QTC Calculation: 440 R Axis:   -17 Text  Interpretation: Sinus rhythm Borderline left axis deviation Confirmed by Isla Pence [phone]) on 11/14/2019 8:21:06 AM   Radiology DG Chest 2 View  Result Date: 11/13/2019 CLINICAL DATA:  Chest pain. EXAM: CHEST - 2 VIEW COMPARISON:  December 08, 2018. FINDINGS: The heart size and mediastinal contours are within normal limits. Both lungs are clear. The visualized skeletal structures are unremarkable. IMPRESSION: No active cardiopulmonary disease. Electronically Signed   By: Constance Holster M.D.   On: 11/13/2019 21:38    Procedures Procedures (including critical care time)  Medications Ordered in ED Medications  sodium chloride flush (NS) 0.9 % injection 3 mL (has no administration in time range)    ED Course  I have reviewed the triage vital signs and the nursing notes.  Pertinent labs & imaging results that were available during my care of the patient were reviewed by me and considered in my medical decision making (see chart for details).    MDM Rules/Calculators/A&P                          9:18 AM patient is a 49 year old male with a history and physical exam as noted above.  Asymptomatic at this time.  ECG reassuring.  Chest x-ray reassuring.  2 troponins negative.  Due to length of time between prior troponin now, will obtain a third troponin.  We will closely monitor.  9:44 AM RN was unable to pull lab specimen from patient's IV.  Patient states "I do not want to be stuck again and I am leaving".  Due to patient's 2 negative troponin, reassuring ECG, and him having been  asymptomatic all morning, will discharge at this time.  He is planning on following up with his cardiologist Dr. Jacinto Halim first thing tomorrow morning.  He was given strict return precautions.  His questions were answered and he was amicable to time of discharge.  His vital signs are stable.  Patient discharged to home/self care.  Condition at discharge: Stable  Note: Portions of this report may have been  transcribed using voice recognition software. Every effort was made to ensure accuracy; however, inadvertent computerized transcription errors may be present.    Final Clinical Impression(s) / ED Diagnoses Final diagnoses:  Chest pain, unspecified type   Rx / DC Orders ED Discharge Orders    None       Placido Sou, PA-C 11/14/19 [id number]    Jacalyn Lefevre, MD 11/14/19 1212

## 2019-11-14 NOTE — Discharge Instructions (Signed)
Follow up with Dr. Jacinto Halim tomorrow.  Please come back if your symptoms worsen.  It was a pleasure to meet you.

## 2019-11-15 ENCOUNTER — Other Ambulatory Visit: Payer: Self-pay

## 2019-11-15 DIAGNOSIS — R079 Chest pain, unspecified: Secondary | ICD-10-CM

## 2019-11-15 MED ORDER — NITROGLYCERIN 0.4 MG SL SUBL: 0.4000 mg | SUBLINGUAL_TABLET | SUBLINGUAL | 3 refills | Status: DC | PRN

## 2019-12-27 ENCOUNTER — Ambulatory Visit: Payer: 59 | Admitting: Cardiology

## 2019-12-30 ENCOUNTER — Emergency Department (HOSPITAL_COMMUNITY)
Admission: EM | Admit: 2019-12-30 | Discharge: 2019-12-30 | Disposition: A | Discharge status: Home / Self Care | Payer: 59 | Attending: Emergency Medicine | Admitting: Emergency Medicine

## 2019-12-30 ENCOUNTER — Other Ambulatory Visit: Payer: Self-pay

## 2019-12-30 ENCOUNTER — Emergency Department (HOSPITAL_COMMUNITY): Payer: 59

## 2019-12-30 DIAGNOSIS — Z7982 Long term (current) use of aspirin: Secondary | ICD-10-CM | POA: Diagnosis not present

## 2019-12-30 DIAGNOSIS — F17210 Nicotine dependence, cigarettes, uncomplicated: Secondary | ICD-10-CM | POA: Insufficient documentation

## 2019-12-30 DIAGNOSIS — I2510 Atherosclerotic heart disease of native coronary artery without angina pectoris: Secondary | ICD-10-CM | POA: Insufficient documentation

## 2019-12-30 DIAGNOSIS — I10 Essential (primary) hypertension: Secondary | ICD-10-CM | POA: Diagnosis not present

## 2019-12-30 DIAGNOSIS — Z79899 Other long term (current) drug therapy: Secondary | ICD-10-CM | POA: Diagnosis not present

## 2019-12-30 DIAGNOSIS — N23 Unspecified renal colic: Secondary | ICD-10-CM | POA: Insufficient documentation

## 2019-12-30 DIAGNOSIS — E119 Type 2 diabetes mellitus without complications: Secondary | ICD-10-CM | POA: Insufficient documentation

## 2019-12-30 DIAGNOSIS — R339 Retention of urine, unspecified: Secondary | ICD-10-CM | POA: Diagnosis present

## 2019-12-30 LAB — URINALYSIS, ROUTINE W REFLEX MICROSCOPIC
Bilirubin Urine: NEGATIVE
Glucose, UA: NEGATIVE mg/dL
Ketones, ur: NEGATIVE mg/dL
Leukocytes,Ua: NEGATIVE
Nitrite: NEGATIVE
Protein, ur: 30 mg/dL — AB
RBC / HPF: >50 RBC/hpf — ABNORMAL HIGH (ref 0–5)
Specific Gravity, Urine: 1.018 (ref 1.005–1.030)
pH: 5 (ref 5.0–8.0)

## 2019-12-30 MED ORDER — KETOROLAC TROMETHAMINE 60 MG/2ML IM SOLN
60.0000 mg | Freq: Once | INTRAMUSCULAR | Status: AC
  Administered 2019-12-30: 60 mg via INTRAMUSCULAR
  Filled 2019-12-30: qty 2

## 2019-12-30 MED ORDER — TAMSULOSIN HCL 0.4 MG PO CAPS: 0.4000 mg | ORAL_CAPSULE | Freq: Every day | ORAL | 0 refills | Status: DC

## 2019-12-30 MED ORDER — OXYCODONE-ACETAMINOPHEN 5-325 MG PO TABS: 1.0000 | ORAL_TABLET | ORAL | 0 refills | Status: DC | PRN

## 2019-12-30 MED ORDER — CEPHALEXIN 500 MG PO CAPS
500.0000 mg | ORAL_CAPSULE | Freq: Two times a day (BID) | ORAL | 0 refills | Status: DC
Start: 2019-12-30 — End: 2020-01-06

## 2019-12-30 NOTE — ED Provider Notes (Signed)
[facility] EMERGENCY DEPARTMENT Provider Note   CSN: [id number] Arrival date & time: 12/30/19  0234     History Chief Complaint  Patient presents with  . Urinary Retention    Manuel Carey is a 49 y.o. male.  Patient presents to the emergency department with right-sided back pain that began earlier tonight.  Pain was quite severe initially but has eased off some.  Pain now radiating around into the front.  Patient reports that he has noticed inability to pass any urine.        Past Medical History:  Diagnosis Date  . Diabetes mellitus type 2 with complications (HCC)   . Environmental allergies   . Heart attack (HCC)    2015, DES x 1, xience stent proximal LAD  . HTN (hypertension)   . Hyperlipemia     Patient Active Problem List   Diagnosis Date Noted  . Diabetes mellitus type 2 with complications (HCC)   . CAD (coronary artery disease) 03/30/2019  . Prediabetes   . Heart attack (HCC)   . NSTEMI (non-ST elevated myocardial infarction) (HCC) 12/04/2013    Past Surgical History:  Procedure Laterality Date  . KNEE ARTHROSCOPY W/ MENISCECTOMY     left knee  . LEFT HEART CATHETERIZATION WITH CORONARY ANGIOGRAM N/A 12/06/2013   Procedure: LEFT HEART CATHETERIZATION WITH CORONARY ANGIOGRAM;  Surgeon: [name], MD;  Location: [facility] CATH LAB;  Service: Cardiovascular;  Laterality: N/A;  . PERCUTANEOUS STENT INTERVENTION  12/06/2013   Procedure: PERCUTANEOUS STENT INTERVENTION;  Surgeon: [name], MD;  Location: [facility] CATH LAB;  Service: Cardiovascular;;       Family History  Problem Relation Age of Onset  . Cancer Mother        breast  . Diabetes Father   . Hyperlipidemia Father   . Hypertension Father     Social History   Tobacco Use  . Smoking status: Current Every Day Smoker    Packs/day: 0.50    Years: 25.00    Pack years: 12.50    Types: Cigarettes  . Smokeless tobacco: Never Used  Vaping Use  . Vaping Use: Never  used  Substance Use Topics  . Alcohol use: Not Currently  . Drug use: Never    Home Medications Prior to Admission medications   Medication Sig Start Date End Date Taking? Authorizing Provider  aspirin 81 MG chewable tablet Chew 1 tablet (81 mg total) by mouth daily. 12/07/13   Yates Decamp, MD  atorvastatin (LIPITOR) 80 MG tablet TAKE 1 TABLET BY MOUTH  DAILY 10/13/19   Yates Decamp, MD  cephALEXin (KEFLEX) 500 MG capsule Take 1 capsule (500 mg total) by mouth 2 (two) times daily. 12/30/19   Gilda Crease, MD  fluticasone (FLONASE) 50 MCG/ACT nasal spray Place 2 sprays into both nostrils daily. Patient taking differently: Place 2 sprays into both nostrils as needed for allergies.  08/10/18   Donita Brooks, MD  furosemide (LASIX) 20 MG tablet Take 1 tablet (20 mg total) by mouth daily as needed for fluid or edema. Patient not taking: Reported on 04/06/2019 12/08/18   Danelle Berry, PA-C  ibuprofen (ADVIL) 200 MG tablet Take 200 mg by mouth as needed for headache or moderate pain.    [provider]  levocetirizine (XYZAL) 5 MG tablet TAKE 1 TABLET BY MOUTH  DAILY IN THE EVENING Patient taking differently: Take 5 mg by mouth every evening. TAKE 1 TABLET BY MOUTH  DAILY IN THE EVENING  11/01/19   Donita Brooks, MD  metoprolol tartrate (LOPRESSOR) 25 MG tablet TAKE 1 TABLET BY MOUTH  TWICE DAILY Patient taking differently: Take 12.5 mg by mouth 2 (two) times daily.  08/23/19   Yates Decamp, MD  nitroGLYCERIN (NITROSTAT) 0.4 MG SL tablet Place 1 tablet (0.4 mg total) under the tongue every 5 (five) minutes as needed for chest pain. 11/15/19 02/13/20  Tolia, Sunit, DO  oxyCODONE-acetaminophen (PERCOCET) 5-325 MG tablet Take 1-2 tablets by mouth every 4 (four) hours as needed. 12/30/19   Gilda Crease, MD  potassium chloride SA (K-DUR) 20 MEQ tablet Take 1 tablet (20 mEq total) by mouth daily. When taking lasix prn Patient not taking: Reported on 04/06/2019 12/08/18   Danelle Berry,  PA-C  predniSONE (DELTASONE) 20 MG tablet 3 tabs poqday 1-2, 2 tabs poqday 3-4, 1 tab poqday 5-6 Patient not taking: Reported on 11/14/2019 04/06/19   Donita Brooks, MD  tamsulosin (FLOMAX) 0.4 MG CAPS capsule Take 1 capsule (0.4 mg total) by mouth daily. 12/30/19   Sandip Power, Canary Brim, MD  VENTOLIN HFA 108 (90 Base) MCG/ACT inhaler TAKE 2 PUFFS BY MOUTH EVERY 6 HOURS AS NEEDED FOR WHEEZE OR SHORTNESS OF BREATH Patient taking differently: Inhale 2 puffs into the lungs every 4 (four) hours as needed for wheezing or shortness of breath.  09/14/18   Donita Brooks, MD    Allergies    Patient has no known allergies.  Review of Systems   Review of Systems  Genitourinary: Positive for decreased urine volume and flank pain.  All other systems reviewed and are negative.   Physical Exam Updated Vital Signs BP 110/64 (BP Location: Left Arm)   Pulse 66   Temp 97.8 F (36.6 C) (Oral)   Resp 18   Ht 5\' 9"  (1.753 m)   Wt (!) 145.2 kg   SpO2 97%   BMI 47.26 kg/m   Physical Exam Vitals and nursing note reviewed.  Constitutional:      General: He is not in acute distress.    Appearance: Normal appearance. He is well-developed.  HENT:     Head: Normocephalic and atraumatic.     Right Ear: Hearing normal.     Left Ear: Hearing normal.     Nose: Nose normal.  Eyes:     Conjunctiva/sclera: Conjunctivae normal.     Pupils: Pupils are equal, round, and reactive to light.  Cardiovascular:     Rate and Rhythm: Regular rhythm.     Heart sounds: S1 normal and S2 normal. No murmur heard.  No friction rub. No gallop.   Pulmonary:     Effort: Pulmonary effort is normal. No respiratory distress.     Breath sounds: Normal breath sounds.  Chest:     Chest wall: No tenderness.  Abdominal:     General: Bowel sounds are normal.     Palpations: Abdomen is soft.     Tenderness: There is no abdominal tenderness. There is no guarding or rebound. Negative signs include Murphy's sign and  McBurney's sign.     Hernia: No hernia is present.  Musculoskeletal:        General: Normal range of motion.     Cervical back: Normal range of motion and neck supple.  Skin:    General: Skin is warm and dry.     Findings: No rash.  Neurological:     Mental Status: He is alert and oriented to person, place, and time.     GCS:  GCS eye subscore is 4. GCS verbal subscore is 5. GCS motor subscore is 6.     Cranial Nerves: No cranial nerve deficit.     Sensory: No sensory deficit.     Coordination: Coordination normal.  Psychiatric:        Speech: Speech normal.        Behavior: Behavior normal.        Thought Content: Thought content normal.     ED Results / Procedures / Treatments   Labs (all labs ordered are listed, but only abnormal results are displayed) Labs Reviewed  URINALYSIS, ROUTINE W REFLEX MICROSCOPIC - Abnormal; Notable for the following components:      Result Value   APPearance HAZY (*)    Hgb urine dipstick LARGE (*)    Protein, ur 30 (*)    RBC / HPF >50 (*)    Bacteria, UA RARE (*)    All other components within normal limits  URINE CULTURE    EKG None  Radiology CT Renal Stone Study  Result Date: 12/30/2019 CLINICAL DATA:  Right-sided flank pain EXAM: CT ABDOMEN AND PELVIS WITHOUT CONTRAST TECHNIQUE: Multidetector CT imaging of the abdomen and pelvis was performed following the standard protocol without IV contrast. COMPARISON:  Radiograph 12/08/2018 FINDINGS: Lower chest: Lung bases demonstrate no acute consolidation or effusion. Coronary vascular calcifications. Hepatobiliary: Multiple calcified gallstones. No focal hepatic abnormality or biliary dilatation Pancreas: Unremarkable. No pancreatic ductal dilatation or surrounding inflammatory changes. Spleen: Normal in size without focal abnormality. Adrenals/Urinary Tract: Adrenal glands are normal. Mild right hydronephrosis and hydroureter. Punctate 2 mm stone at or just past the right UVJ the posterior  aspect of the bladder. Left kidney is within normal limits. Stomach/Bowel: Stomach is within normal limits. Appendix appears normal. No evidence of bowel wall thickening, distention, or inflammatory changes. Vascular/Lymphatic: Mild aortic atherosclerosis. No aneurysm. No suspicious nodes Reproductive: Prostate is unremarkable. Other: Negative for free air or free fluid. Small fat containing inguinal hernia. Small fat containing umbilical hernia Musculoskeletal: No acute or significant osseous findings. IMPRESSION: 1. Mild right hydronephrosis and hydroureter. Punctate 2 mm stone at the right posterior bladder consistent with recently passed or impending passage of stone. 2. Gallstones. Aortic Atherosclerosis (ICD10-I70.0). Electronically Signed   By: Jasmine Pang M.D.   On: 12/30/2019 03:28    Procedures Procedures (including critical care time)  Medications Ordered in ED Medications  ketorolac (TORADOL) injection 60 mg (60 mg Intramuscular Given 12/30/19 0500)    ED Course  I have reviewed the triage vital signs and the nursing notes.  Pertinent labs & imaging results that were available during my care of the patient were reviewed by me and considered in my medical decision making (see chart for details).    MDM Rules/Calculators/A&P                          Patient presents to the emergency department with sudden onset right flank pain with radiation to the groin.  CT scan confirms 2 mm distal UVJ stone with hydronephrosis.  Urinalysis does have some white cells, likely secondary to the hematuria.  Will culture.  Empirically prescribed Keflex.  Patient given return precautions.  Final Clinical Impression(s) / ED Diagnoses Final diagnoses:  Renal colic on right side    Rx / DC Orders ED Discharge Orders         Ordered    oxyCODONE-acetaminophen (PERCOCET) 5-325 MG tablet  Every 4 hours PRN  Discontinue  Reprint     12/30/19 0552    tamsulosin (FLOMAX) 0.4 MG CAPS capsule   Daily     Discontinue  Reprint     12/30/19 0552    cephALEXin (KEFLEX) 500 MG capsule  2 times daily     Discontinue  Reprint     12/30/19 0552           Gilda Crease, MD 12/30/19 0730

## 2019-12-30 NOTE — ED Triage Notes (Signed)
Pt states that he was having back right sided pain earlier, but now it is radiating to RLQ. Denies hx of kidney stones. States unable to void - last time was 06:oo but unable to go.

## 2019-12-31 LAB — URINE CULTURE: Culture: NO GROWTH

## 2020-01-06 ENCOUNTER — Ambulatory Visit: Payer: 59 | Admitting: Cardiology

## 2020-01-06 ENCOUNTER — Other Ambulatory Visit: Payer: Self-pay

## 2020-01-06 ENCOUNTER — Encounter: Payer: Self-pay | Admitting: Cardiology

## 2020-01-06 VITALS — BP 150/90 | HR 60 | Resp 16 | Ht 69.0 in | Wt 336.0 lb

## 2020-01-06 DIAGNOSIS — Z955 Presence of coronary angioplasty implant and graft: Secondary | ICD-10-CM

## 2020-01-06 DIAGNOSIS — E782 Mixed hyperlipidemia: Secondary | ICD-10-CM

## 2020-01-06 DIAGNOSIS — R0609 Other forms of dyspnea: Secondary | ICD-10-CM

## 2020-01-06 DIAGNOSIS — E119 Type 2 diabetes mellitus without complications: Secondary | ICD-10-CM

## 2020-01-06 DIAGNOSIS — R0600 Dyspnea, unspecified: Secondary | ICD-10-CM

## 2020-01-06 DIAGNOSIS — I10 Essential (primary) hypertension: Secondary | ICD-10-CM

## 2020-01-06 DIAGNOSIS — Z720 Tobacco use: Secondary | ICD-10-CM

## 2020-01-06 DIAGNOSIS — I2510 Atherosclerotic heart disease of native coronary artery without angina pectoris: Secondary | ICD-10-CM

## 2020-01-06 MED ORDER — LOSARTAN POTASSIUM 25 MG PO TABS
25.0000 mg | ORAL_TABLET | Freq: Every evening | ORAL | 0 refills | Status: DC
Start: 2020-01-06 — End: 2020-03-02

## 2020-01-06 NOTE — Progress Notes (Signed)
Manuel Carey Date of Birth: [birth year] MRN: [id number] Primary Care Provider:Pickard, Cammie Mcgee, MD Former Cardiology Providers: Jeri Lager, APRN, FNP-C, Dr. Adrian Prows Primary Cardiologist: Rex Kras, DO, [facility] (established care 01/06/2020)  Date: 01/06/20 Last Visit: 12/10/2018  Chief Complaint  Patient presents with  . Coronary Artery Disease  . Follow-up    1 year    HPI  Manuel Carey is a 49 y.o.  male who presents to the office with a chief complaint of " 1 year follow-up for heart disease." Patient's past medical history and cardiovascular risk factors include: hypertension, hyperlipidemia, ongoing tobacco use, diabetes,  CAD and NSTEMI on 12/03/2013 S/P 3 x 33 mm Xience DES proximal LAD on 12/06/2013.   Patient was formally under the care of Jeri Lager, APRN, FNP-C and  Dr. Adrian Prows for the management of coronary artery disease.  He presents today for 1 year follow-up.  Since last office visit patient has not had any chest pain or anginal equivalent.  He has not required use of sublingual nitroglycerin tablets.  He continues to smoke but has been decreasing it steadily.  Patient states that he is now 1 pack/week.  Patient's blood pressure in the office are not at goal.  Patient states that he checks his blood pressure once a week systolic blood pressures usually range between 341-937 mmHg diastolic blood pressure around 80 mmHg.   Patient continues to be short of breath with effort related activities with this chronic and stable he is attributing this to deconditioning, obesity.  Since last office visit patient had an echocardiogram results reviewed.  Patient's LVEF remained stable.  Patient has high clinical suspicion for obstructive sleep apnea.  Patient states that he was referred to sleep medicine prior to COVID-19 pandemic by his PCP but he never went due to the pandemic.  He does have elevated blood pressures in the morning, dry mouth in the morning when he  wakes up, snores at night, his neck is quite short, and wears shirts that are 3X in size.  Patient is encouraged to follow-up with sleep medicine for evaluation of sleep apnea.   ALLERGIES: No Known Allergies   MEDICATION LIST PRIOR TO VISIT: Current Outpatient Medications on File Prior to Visit  Medication Sig Dispense Refill  . aspirin 81 MG chewable tablet Chew 1 tablet (81 mg total) by mouth daily.    Marland Kitchen atorvastatin (LIPITOR) 80 MG tablet TAKE 1 TABLET BY MOUTH  DAILY 90 tablet 3  . ibuprofen (ADVIL) 200 MG tablet Take 200 mg by mouth as needed for headache or moderate pain.    . metoprolol tartrate (LOPRESSOR) 25 MG tablet TAKE 1 TABLET BY MOUTH  TWICE DAILY (Patient taking differently: Take 12.5 mg by mouth 2 (two) times daily. ) 60 tablet 0  . nitroGLYCERIN (NITROSTAT) 0.4 MG SL tablet Place 1 tablet (0.4 mg total) under the tongue every 5 (five) minutes as needed for chest pain. 25 tablet 3  . oxyCODONE-acetaminophen (PERCOCET) 5-325 MG tablet Take 1-2 tablets by mouth every 4 (four) hours as needed. 20 tablet 0  . potassium chloride SA (K-DUR) 20 MEQ tablet Take 1 tablet (20 mEq total) by mouth daily. When taking lasix prn 7 tablet 1  . predniSONE (DELTASONE) 20 MG tablet 3 tabs poqday 1-2, 2 tabs poqday 3-4, 1 tab poqday 5-6 12 tablet 0  . tamsulosin (FLOMAX) 0.4 MG CAPS capsule Take 1 capsule (0.4 mg total) by mouth daily. 7 capsule 0  . VENTOLIN HFA  108 (90 Base) MCG/ACT inhaler TAKE 2 PUFFS BY MOUTH EVERY 6 HOURS AS NEEDED FOR WHEEZE OR SHORTNESS OF BREATH (Patient taking differently: Inhale 2 puffs into the lungs every 4 (four) hours as needed for wheezing or shortness of breath. ) 18 Inhaler 0   No current facility-administered medications on file prior to visit.    PAST MEDICAL HISTORY: Past Medical History:  Diagnosis Date  . Diabetes mellitus type 2 with complications ([place])   . Environmental allergies   . Heart attack ([place])    2015, DES x 1, xience stent proximal LAD    . HTN (hypertension)   . Hyperlipemia     PAST SURGICAL HISTORY: Past Surgical History:  Procedure Laterality Date  . KNEE ARTHROSCOPY W/ MENISCECTOMY     left knee  . LEFT HEART CATHETERIZATION WITH CORONARY ANGIOGRAM N/A 12/06/2013   Procedure: LEFT HEART CATHETERIZATION WITH CORONARY ANGIOGRAM;  Surgeon: Laverda Page, MD;  Location[facility] CATH LAB;  Service: Cardiovascular;  Laterality: N/A;  . PERCUTANEOUS STENT INTERVENTION  12/06/2013   Procedure: PERCUTANEOUS STENT INTERVENTION;  Surgeon: Laverda Page, MD;  Location: [facility] CATH LAB;  Service: Cardiovascular;;    FAMILY HISTORY: The patient's family history includes Cancer in his mother; Diabetes in his father; Hyperlipidemia in his father; Hypertension in his father.   SOCIAL HISTORY:  The patient  reports that he has been smoking cigarettes. He has a 12.50 pack-year smoking history. He has never used smokeless tobacco. He reports previous alcohol use. He reports that he does not use drugs.  Review of Systems  Constitutional: Negative for chills and fever.  HENT: Negative for hoarse voice and nosebleeds.   Eyes: Negative for discharge, double vision and pain.  Cardiovascular: Positive for dyspnea on exertion and leg swelling. Negative for chest pain, claudication, near-syncope, orthopnea, palpitations, paroxysmal nocturnal dyspnea and syncope. Cyanosis: chronic and stable.  Respiratory: Positive for snoring. Negative for hemoptysis and shortness of breath.   Musculoskeletal: Negative for muscle cramps and myalgias.  Gastrointestinal: Negative for abdominal pain, constipation, diarrhea, hematemesis, hematochezia, melena, nausea and vomiting.  Neurological: Negative for dizziness and light-headedness.    PHYSICAL EXAM: Vitals with BMI 01/06/2020 01/06/2020 12/30/2019  Height - _0  _1   Weight - 336 lbs 320 lbs  BMI - 42.3 53.61  Systolic 443 154 008  Diastolic 90 96 64  Pulse 60 66 66    CONSTITUTIONAL:  Well-developed and well-nourished. No acute distress.  SKIN: Skin is warm and dry. No rash noted. No cyanosis. No pallor. No jaundice HEAD: Normocephalic and atraumatic.  EYES: No scleral icterus MOUTH/THROAT: Moist oral membranes.  NECK: Neck is short, significant soft tissue, unable to evaluate JVP, no carotid bruits  LYMPHATIC: No visible cervical adenopathy.  CHEST Normal respiratory effort. No intercostal retractions  LUNGS: Clear to auscultation bilaterally.  No stridor. No wheezes. No rales.  CARDIOVASCULAR: Regular, positive S1-S2, no murmurs rubs or gallops appreciated. ABDOMINAL: Obese, soft, nontenderness nondistended, positive bowel sounds in all 4 quadrants, no apparent ascites.  EXTREMITIES: +1 bilateral peripheral edema  HEMATOLOGIC: No significant bruising NEUROLOGIC: Oriented to person, place, and time. Nonfocal. Normal muscle tone.  PSYCHIATRIC: Normal mood and affect. Normal behavior. Cooperative  RADIOLOGY: CT renal stone study 12/30/2019: Aortic atherosclerosis.  CARDIAC DATABASE: EKG: 01/06/2020: Normal sinus rhythm, 62 bpm, old anterior infarct, nonspecific T wave abnormality, without underlying injury pattern.  Echocardiogram: 12/16/2018 :  Normal LV systolic function with visual EF 55-60%. Left ventricle cavity is normal in size. Mild concentric  hypertrophy of the left ventricle. Normal global wall motion. Normal diastolic filling pattern. No significant valvular abnormality.  Stress Testing:  Treadmill stress test [12/24/2013]: Indications: Assessment of Chest Pain. Conclusions: Negative for ischemia. Continue primary prevention The patient exercised according to the Bruce protocol, Total time recorded 9 Min. 0 sec. achieving a max heart rate of 164 which was 93% of MPHR for age and 10.0 METS of work. Baseline NIBP was 122/74. Peak NIBP was 122/74 MaxSysp was: 122 MaxDiasp was: 74.The baseline ECG showed NSR,LAD, LAFB. During exercise there was no ST-T changes of  ischemia. Symptoms: THR met. Mild dyspnea. Arrhythmia: Rare PAC  Heart Catheterization: Coronary Angiogram 12/03/2013: S/P 3 x 33 mm Xience DES proximal LAD on 12/06/2013. Mild disease in other vessels and mild ectasion in distal RCA.  LABORATORY DATA: CBC Latest Ref Rng & Units 11/13/2019 04/06/2019 11/30/2018  WBC 4.0 - 10.5 K/uL 9.9 8.0 9.5  Hemoglobin 13.0 - 17.0 g/dL 14.3 13.7 13.9  Hematocrit 39 - 52 % 43.6 41.8 42.6  Platelets 150 - 400 K/uL 205 219 193    CMP Latest Ref Rng & Units 11/13/2019 04/06/2019 12/08/2018  Glucose 70 - 99 mg/dL 137(H) 126(H) 126(H)  BUN 6 - 20 mg/dL _0 Creatinine 0.61 - 1.24 mg/dL 0.93 0.91 0.78  Sodium 135 - 145 mmol/L 136 139 141  Potassium 3.5 - 5.1 mmol/L 3.7 4.5 3.8  Chloride 98 - 111 mmol/L 104 103 104  CO2 22 - 32 mmol/L _1 Calcium 8.9 - 10.3 mg/dL 8.8(L) 9.1 8.9  Total Protein 6.1 - 8.1 g/dL - 5.9(L) 5.8(L)  Total Bilirubin 0.2 - 1.2 mg/dL - 0.7 0.6  Alkaline Phos 39 - 117 U/L - - -  AST 10 - 40 U/L - 17 16  ALT 9 - 46 U/L - 26 29    Lipid Panel     Component Value Date/Time   CHOL 107 04/06/2019 0838   TRIG 131 04/06/2019 0838   HDL 27 (L) 04/06/2019 0838   CHOLHDL 4.0 04/06/2019 0838   VLDL 36 12/04/2013 0310   LDLCALC 59 04/06/2019 0838    Lab Results  Component Value Date   HGBA1C 6.6 (H) 04/06/2019   HGBA1C 5.7 (H) 12/04/2013   No components found for: NTPROBNP No results found for: TSH  Cardiac Panel (last 3 results) No results for input(s): CKTOTAL, CKMB, TROPONINIHS, RELINDX in the last 72 hours.  IMPRESSION:    ICD-10-CM   1. Atherosclerosis of native coronary artery of native heart without angina pectoris  I25.10   2. History of coronary angioplasty with insertion of stent  Z95.5   3. Dyspnea on exertion  R06.00   4. Essential hypertension  I10 EKG 67-MCNO    Basic metabolic panel    Magnesium  5. Tobacco use  Z72.0   6. Non-insulin dependent type 2 diabetes mellitus ([place])  E11.9   7. Mixed  hyperlipidemia  E78.2      RECOMMENDATIONS: DACEN FRAYRE is a 49 y.o. male whose past medical history and cardiovascular risk factors include: hypertension, hyperlipidemia, ongoing tobacco use, diabetes,  CAD and NSTEMI on 12/03/2013 S/P 3 x 33 mm Xience DES proximal LAD on 12/06/2013.   Established coronary artery disease without angina pectoris:  Continue aspirin.  Continue statin therapy.    Continue metoprolol 12.5 mg p.o. twice daily.  Recommended transitioning to Toprol-XL but patient states that he has a lot of Lopressor at home.  He would like to finish  what he has prior to transitioning to Toprol-XL.  Most recent echocardiogram results reviewed with the patient.  Benign essential hypertension:  Office blood pressure currently not at goal.  Recommended better blood pressure management with a goal systolic blood pressure around 130 mmHg given his underlying diabetes mellitus.  For now we will start losartan 25 mg p.o. daily and will repeat blood work in 1 week to evaluate his kidney function electrolytes.  I have asked him to talk to his primary care physician in regards to further up titration of medications as he is going in for a yearly physical.  He is asked to keep a log of his blood pressures and to show it to his PCP at the upcoming visit.  Active tobacco use: Educated on importance of complete smoking cessation.  Non-insulin-dependent diabetes mellitus type 2: Currently managed by primary care provider.  Last hemoglobin A1c 6.6 (03/2019)   Mixed hyperlipidemia: . Continue statin therapy.   . Patient denies myalgia or other side effects. . Patient had a recent CT scan which noted aortic atherosclerosis. . I do not have a recent lipid profile for review. . Patient states that he is due for yearly physical and will have a fasting lipid profile done at that time. . Patient is asked to bring in his lab work at the next office visit for review.   FINAL MEDICATION  LIST END OF ENCOUNTER: Meds ordered this encounter  Medications  . losartan (COZAAR) 25 MG tablet    Sig: Take 1 tablet (25 mg total) by mouth every evening.    Dispense:  90 tablet    Refill:  0     Current Outpatient Medications:  .  aspirin 81 MG chewable tablet, Chew 1 tablet (81 mg total) by mouth daily., Disp: , Rfl:  .  atorvastatin (LIPITOR) 80 MG tablet, TAKE 1 TABLET BY MOUTH  DAILY, Disp: 90 tablet, Rfl: 3 .  ibuprofen (ADVIL) 200 MG tablet, Take 200 mg by mouth as needed for headache or moderate pain., Disp: , Rfl:  .  metoprolol tartrate (LOPRESSOR) 25 MG tablet, TAKE 1 TABLET BY MOUTH  TWICE DAILY (Patient taking differently: Take 12.5 mg by mouth 2 (two) times daily. ), Disp: 60 tablet, Rfl: 0 .  nitroGLYCERIN (NITROSTAT) 0.4 MG SL tablet, Place 1 tablet (0.4 mg total) under the tongue every 5 (five) minutes as needed for chest pain., Disp: 25 tablet, Rfl: 3 .  oxyCODONE-acetaminophen (PERCOCET) 5-325 MG tablet, Take 1-2 tablets by mouth every 4 (four) hours as needed., Disp: 20 tablet, Rfl: 0 .  potassium chloride SA (K-DUR) 20 MEQ tablet, Take 1 tablet (20 mEq total) by mouth daily. When taking lasix prn, Disp: 7 tablet, Rfl: 1 .  predniSONE (DELTASONE) 20 MG tablet, 3 tabs poqday 1-2, 2 tabs poqday 3-4, 1 tab poqday 5-6, Disp: 12 tablet, Rfl: 0 .  tamsulosin (FLOMAX) 0.4 MG CAPS capsule, Take 1 capsule (0.4 mg total) by mouth daily., Disp: 7 capsule, Rfl: 0 .  VENTOLIN HFA 108 (90 Base) MCG/ACT inhaler, TAKE 2 PUFFS BY MOUTH EVERY 6 HOURS AS NEEDED FOR WHEEZE OR SHORTNESS OF BREATH (Patient taking differently: Inhale 2 puffs into the lungs every 4 (four) hours as needed for wheezing or shortness of breath. ), Disp: 18 Inhaler, Rfl: 0 .  losartan (COZAAR) 25 MG tablet, Take 1 tablet (25 mg total) by mouth every evening., Disp: 90 tablet, Rfl: 0  Orders Placed This Encounter  Procedures  . Basic metabolic panel  .  Magnesium  . EKG 12-Lead   --Continue cardiac medications  as reconciled in final medication list. --Return in about 6 months (around 07/08/2020) for CAD follow up . Or sooner if needed. --Continue follow-up with your primary care physician regarding the management of your other chronic comorbid conditions.  Patient's questions and concerns were addressed to his satisfaction. He voices understanding of the instructions provided during this encounter.   This note was created using a voice recognition software as a result there may be grammatical errors inadvertently enclosed that do not reflect the nature of this encounter. Every attempt is made to correct such errors.  Rex Kras, [place], [facility]  Pager: [phone] Office: [phone]

## 2020-01-26 LAB — BASIC METABOLIC PANEL
BUN/Creatinine Ratio: 14 (ref 9–20)
BUN: 11 mg/dL (ref 6–24)
CO2: 25 mmol/L (ref 20–29)
Calcium: 9.2 mg/dL (ref 8.7–10.2)
Chloride: 102 mmol/L (ref 96–106)
Creatinine, Ser: 0.78 mg/dL (ref 0.76–1.27)
GFR calc Af Amer: 122 mL/min/{1.73_m2} (ref >59)
GFR calc non Af Amer: 105 mL/min/{1.73_m2} (ref >59)
Glucose: 156 mg/dL — ABNORMAL HIGH (ref 65–99)
Potassium: 4.7 mmol/L (ref 3.5–5.2)
Sodium: 140 mmol/L (ref 134–144)

## 2020-01-26 LAB — MAGNESIUM: Magnesium: 2.1 mg/dL (ref 1.6–2.3)

## 2020-03-02 ENCOUNTER — Other Ambulatory Visit: Payer: Self-pay | Admitting: Cardiology

## 2020-06-10 ENCOUNTER — Other Ambulatory Visit: Payer: Self-pay | Admitting: Cardiology

## 2020-07-10 ENCOUNTER — Ambulatory Visit: Payer: 59 | Admitting: Cardiology

## 2020-07-12 ENCOUNTER — Other Ambulatory Visit: Payer: Self-pay

## 2020-07-12 ENCOUNTER — Other Ambulatory Visit: Payer: Self-pay | Admitting: Cardiology

## 2020-07-12 MED ORDER — LOSARTAN POTASSIUM 25 MG PO TABS: 25.0000 mg | ORAL_TABLET | Freq: Every evening | ORAL | 2 refills | Status: DC

## 2020-07-31 ENCOUNTER — Ambulatory Visit: Payer: 59 | Admitting: Cardiology

## 2020-08-22 ENCOUNTER — Other Ambulatory Visit: Payer: Self-pay | Admitting: Cardiology

## 2020-09-11 ENCOUNTER — Ambulatory Visit: Payer: 59 | Admitting: Cardiology

## 2020-10-16 ENCOUNTER — Other Ambulatory Visit: Payer: Self-pay | Admitting: Cardiology

## 2020-12-25 ENCOUNTER — Ambulatory Visit (INDEPENDENT_AMBULATORY_CARE_PROVIDER_SITE_OTHER): Payer: 59 | Admitting: Family Medicine

## 2020-12-25 ENCOUNTER — Other Ambulatory Visit: Payer: Self-pay

## 2020-12-25 ENCOUNTER — Encounter: Payer: Self-pay | Admitting: Family Medicine

## 2020-12-25 VITALS — BP 142/80 | HR 62 | Temp 97.9°F | Resp 18 | Ht 69.0 in | Wt 339.0 lb

## 2020-12-25 DIAGNOSIS — Z0001 Encounter for general adult medical examination with abnormal findings: Secondary | ICD-10-CM

## 2020-12-25 DIAGNOSIS — R7303 Prediabetes: Secondary | ICD-10-CM | POA: Diagnosis not present

## 2020-12-25 DIAGNOSIS — Z1211 Encounter for screening for malignant neoplasm of colon: Secondary | ICD-10-CM | POA: Diagnosis not present

## 2020-12-25 DIAGNOSIS — Z0000 Encounter for general adult medical examination without abnormal findings: Secondary | ICD-10-CM

## 2020-12-25 DIAGNOSIS — I2510 Atherosclerotic heart disease of native coronary artery without angina pectoris: Secondary | ICD-10-CM

## 2020-12-25 DIAGNOSIS — G4710 Hypersomnia, unspecified: Secondary | ICD-10-CM

## 2020-12-25 NOTE — Progress Notes (Signed)
Subjective:    Patient ID: Manuel Carey, male    DOB: 09-25-1970, 50 y.o.   MRN: [id number]  HPI  Patient is a very pleasant 50 year-old Caucasian male here today for CPE.  Past medical history is significant for a non-ST elevation myocardial infarction in 2015.  Patient underwent cardiac catheterization at that time and had a Xience drug-eluting stent placed in the proximal LAD.  He is followed by cardiology, Dr. Sherril Croon.  He is also on a combination of metoprolol and high-dose Lipitor.  Unfortunately he continues to smoke about 1/2 pack of cigarettes a day.  Unfortunately, Patient has still not been evaluated for obstructive sleep apnea.  He has a very large neck circumference with a short mandible.  We originally scheduled this for him however due to scheduling complex it never occurred.  He is long overdue for fasting lab work particular to check his cholesterol, his A1c, and his PSA.  He is also due for colonoscopy. Past Medical History:  Diagnosis Date   Diabetes mellitus type 2 with complications ([facility])    Environmental allergies    Heart attack ([facility])    2015, DES x 1, xience stent proximal LAD   HTN (hypertension)    Hyperlipemia    Past Surgical History:  Procedure Laterality Date   KNEE ARTHROSCOPY W/ MENISCECTOMY     left knee   LEFT HEART CATHETERIZATION WITH CORONARY ANGIOGRAM N/A 12/06/2013   Procedure: LEFT HEART CATHETERIZATION WITH CORONARY ANGIOGRAM;  Surgeon: [name], MD;  Location[facility] CATH LAB;  Service: Cardiovascular;  Laterality: N/A;   PERCUTANEOUS STENT INTERVENTION  12/06/2013   Procedure: PERCUTANEOUS STENT INTERVENTION;  Surgeon: [name], MD;  Location: [facility] CATH LAB;  Service: Cardiovascular;;   Current Outpatient Medications on File Prior to Visit  Medication Sig Dispense Refill   aspirin 81 MG chewable tablet Chew 1 tablet (81 mg total) by mouth daily.     atorvastatin (LIPITOR) 80 MG tablet TAKE 1 TABLET BY MOUTH  DAILY 90 tablet 3    losartan (COZAAR) 25 MG tablet TAKE 1 TABLET BY MOUTH EVERY DAY IN THE EVENING 30 tablet 2   metoprolol tartrate (LOPRESSOR) 25 MG tablet TAKE 1 TABLET BY MOUTH  TWICE DAILY (Patient taking differently: Take 12.5 mg by mouth 2 (two) times daily.) 60 tablet 0   nitroGLYCERIN (NITROSTAT) 0.4 MG SL tablet Place 1 tablet (0.4 mg total) under the tongue every 5 (five) minutes as needed for chest pain. 25 tablet 3   No current facility-administered medications on file prior to visit.   No Known Allergies Social History   Socioeconomic History   Marital status: Single    Spouse name: Not on file   Number of children: 0   Years of education: Not on file   Highest education level: Not on file  Occupational History   Not on file  Tobacco Use   Smoking status: Every Day    Packs/day: 0.50    Years: 25.00    Pack years: 12.50    Types: Cigarettes   Smokeless tobacco: Never  Vaping Use   Vaping Use: Never used  Substance and Sexual Activity   Alcohol use: Not Currently   Drug use: Never   Sexual activity: Not on file  Other Topics Concern   Not on file  Social History Narrative   Not on file   Social Determinants of Health   Financial Resource Strain: Not on file  Food Insecurity: Not on file  Transportation Needs: Not on file  Physical Activity: Not on file  Stress: Not on file  Social Connections: Not on file  Intimate Partner Violence: Not on file   Family History  Problem Relation Age of Onset   Cancer Mother        breast   Diabetes Father    Hyperlipidemia Father    Hypertension Father      Review of Systems     Objective:   Physical Exam Vitals reviewed.  Constitutional:      General: He is not in acute distress.    Appearance: He is obese. He is not ill-appearing, toxic-appearing or diaphoretic.  HENT:     Head: Normocephalic and atraumatic.     Right Ear: Tympanic membrane and ear canal normal.     Left Ear: Tympanic membrane and ear canal normal.      Nose: Congestion and rhinorrhea present.     Mouth/Throat:     Mouth: Mucous membranes are moist.     Pharynx: No oropharyngeal exudate or posterior oropharyngeal erythema.  Eyes:     General: No scleral icterus.       Right eye: No discharge.        Left eye: No discharge.     Conjunctiva/sclera: Conjunctivae normal.     Pupils: Pupils are equal, round, and reactive to light.  Neck:     Vascular: No carotid bruit.  Cardiovascular:     Rate and Rhythm: Normal rate and regular rhythm.     Heart sounds: Normal heart sounds. No murmur heard.   No friction rub. No gallop.  Pulmonary:     Effort: Pulmonary effort is normal. No respiratory distress.     Breath sounds: No stridor. Wheezing present. No rhonchi or rales.  Chest:     Chest wall: No tenderness.  Abdominal:     General: Bowel sounds are normal. There is no distension.     Palpations: Abdomen is soft. There is no mass.     Tenderness: There is no abdominal tenderness. There is no guarding or rebound.     Hernia: No hernia is present.  Musculoskeletal:     Cervical back: Neck supple. No rigidity. No muscular tenderness.     Right lower leg: No edema.     Left lower leg: No edema.  Lymphadenopathy:     Cervical: No cervical adenopathy.  Skin:    General: Skin is warm.     Coloration: Skin is not jaundiced or pale.     Findings: No bruising, erythema, lesion or rash.  Neurological:     General: No focal deficit present.     Mental Status: He is oriented to person, place, and time.     Cranial Nerves: No cranial nerve deficit.     Sensory: No sensory deficit.     Motor: No weakness.     Coordination: Coordination normal.     Gait: Gait normal.     Deep Tendon Reflexes: Reflexes normal.  Psychiatric:        Mood and Affect: Mood normal.        Behavior: Behavior normal.        Thought Content: Thought content normal.        Judgment: Judgment normal.          Assessment & Plan:  Colon cancer screening - Plan:  Ambulatory referral to Gastroenterology  General medical exam - Plan: CBC with Differential/Platelet, COMPLETE METABOLIC PANEL WITH GFR, Lipid panel, PSA, Hemoglobin  A1c  Coronary artery disease involving native coronary artery of native heart without angina pectoris - Plan: CBC with Differential/Platelet, COMPLETE METABOLIC PANEL WITH GFR, Lipid panel, PSA, Hemoglobin A1c  Prediabetes - Plan: CBC with Differential/Platelet, COMPLETE METABOLIC PANEL WITH GFR, Lipid panel, PSA, Hemoglobin A1c  Hypersomnolence - Plan: Ambulatory referral to Sleep Studies First asked the patient to start checking his blood pressure every day.  If the systolic blood pressures consistently above 140 I would increase his losartan to 50 mg a day.  Second I have asked the patient to come in fasting for lab work.  Ideally I like his LDL cholesterol to be below 70 and his A1c to be below 6.5.  If greater than 6.5 I would recommend Farxiga.  Third, the biggest risk the patient has is his weight, smoking, and untreated sleep apnea.  Therefore I will schedule the patient for a sleep study.  I encouraged diet exercise and weight loss and strongly recommended smoking cessation.  Also recommended Pneumovax 23 but he politely declined.  I will schedule the patient for a colonoscopy

## 2021-01-01 ENCOUNTER — Other Ambulatory Visit: Payer: Self-pay

## 2021-01-01 ENCOUNTER — Other Ambulatory Visit: Payer: 59

## 2021-01-02 LAB — CBC WITH DIFFERENTIAL/PLATELET
Absolute Monocytes: 439 cells/uL (ref 200–950)
Basophils Absolute: 43 cells/uL (ref 0–200)
Basophils Relative: 0.6 %
Eosinophils Absolute: 137 cells/uL (ref 15–500)
Eosinophils Relative: 1.9 %
HCT: 41.3 % (ref 38.5–50.0)
Hemoglobin: 13.8 g/dL (ref 13.2–17.1)
Lymphs Abs: 2786 cells/uL (ref 850–3900)
MCH: 30.8 pg (ref 27.0–33.0)
MCHC: 33.4 g/dL (ref 32.0–36.0)
MCV: 92.2 fL (ref 80.0–100.0)
MPV: 9.5 fL (ref 7.5–12.5)
Monocytes Relative: 6.1 %
Neutro Abs: 3794 cells/uL (ref 1500–7800)
Neutrophils Relative %: 52.7 %
Platelets: 183 10*3/uL (ref 140–400)
RBC: 4.48 10*6/uL (ref 4.20–5.80)
RDW: 12.7 % (ref 11.0–15.0)
Total Lymphocyte: 38.7 %
WBC: 7.2 10*3/uL (ref 3.8–10.8)

## 2021-01-02 LAB — PSA: PSA: 0.47 ng/mL (ref <=4.00)

## 2021-01-02 LAB — COMPLETE METABOLIC PANEL WITH GFR
AG Ratio: 1.9 (calc) (ref 1.0–2.5)
ALT: 38 U/L (ref 9–46)
AST: 25 U/L (ref 10–35)
Albumin: 3.9 g/dL (ref 3.6–5.1)
Alkaline phosphatase (APISO): 121 U/L (ref 35–144)
BUN: 7 mg/dL (ref 7–25)
CO2: 27 mmol/L (ref 20–32)
Calcium: 8.4 mg/dL — ABNORMAL LOW (ref 8.6–10.3)
Chloride: 106 mmol/L (ref 98–110)
Creat: 0.74 mg/dL (ref 0.70–1.30)
Globulin: 2.1 g/dL (calc) (ref 1.9–3.7)
Glucose, Bld: 199 mg/dL — ABNORMAL HIGH (ref 65–99)
Potassium: 3.9 mmol/L (ref 3.5–5.3)
Sodium: 141 mmol/L (ref 135–146)
Total Bilirubin: 0.4 mg/dL (ref 0.2–1.2)
Total Protein: 6 g/dL — ABNORMAL LOW (ref 6.1–8.1)
eGFR: 110 mL/min/{1.73_m2} (ref >=60)

## 2021-01-02 LAB — LIPID PANEL
Cholesterol: 79 mg/dL (ref <200)
HDL: 28 mg/dL — ABNORMAL LOW (ref >=40)
LDL Cholesterol (Calc): 36 mg/dL (calc)
Non-HDL Cholesterol (Calc): 51 mg/dL (calc) (ref <130)
Total CHOL/HDL Ratio: 2.8 (calc) (ref <5.0)
Triglycerides: 73 mg/dL (ref <150)

## 2021-01-02 LAB — HEMOGLOBIN A1C
Hgb A1c MFr Bld: 9.8 % of total Hgb — ABNORMAL HIGH (ref <5.7)
Mean Plasma Glucose: 235 mg/dL
eAG (mmol/L): 13 mmol/L

## 2021-01-03 ENCOUNTER — Other Ambulatory Visit: Payer: Self-pay | Admitting: Family Medicine

## 2021-01-03 ENCOUNTER — Other Ambulatory Visit: Payer: Self-pay | Admitting: *Deleted

## 2021-01-03 MED ORDER — METFORMIN HCL ER 500 MG PO TB24
ORAL_TABLET | ORAL | 3 refills | Status: DC
Start: 2021-01-03 — End: 2021-01-31

## 2021-01-03 MED ORDER — DAPAGLIFLOZIN PROPANEDIOL 10 MG PO TABS: 10.0000 mg | ORAL_TABLET | Freq: Every day | ORAL | 1 refills | Status: DC

## 2021-01-04 ENCOUNTER — Other Ambulatory Visit: Payer: Self-pay | Admitting: Family Medicine

## 2021-01-04 MED ORDER — EMPAGLIFLOZIN 25 MG PO TABS: 25.0000 mg | ORAL_TABLET | Freq: Every day | ORAL | 5 refills | Status: DC

## 2021-01-04 NOTE — Telephone Encounter (Signed)
Received fax requesting alternative to [place].   Reports that insurance prefers [place].   Please advise.

## 2021-01-19 ENCOUNTER — Other Ambulatory Visit: Payer: Self-pay | Admitting: Cardiology

## 2021-01-23 ENCOUNTER — Other Ambulatory Visit: Payer: Self-pay

## 2021-01-23 MED ORDER — LOSARTAN POTASSIUM 25 MG PO TABS: 25.0000 mg | ORAL_TABLET | Freq: Every day | ORAL | 0 refills | Status: DC

## 2021-01-29 ENCOUNTER — Telehealth: Payer: Self-pay | Admitting: *Deleted

## 2021-01-29 NOTE — Telephone Encounter (Signed)
Received call from patient.   Reports that he is currently taking Metformin ER, Farxiga, and Jardiance.   Reports that CBG's are running 120- 130's QAM.   Reports that he is having loose stools daily, sometimes 2-3x daily.   Requested to change Metformin. Please advise.

## 2021-01-30 NOTE — Telephone Encounter (Signed)
Call placed to patient. LMTRC.  

## 2021-01-31 MED ORDER — EMPAGLIFLOZIN 25 MG PO TABS: 25.0000 mg | ORAL_TABLET | Freq: Every day | ORAL | 5 refills | Status: DC

## 2021-01-31 MED ORDER — GLIPIZIDE ER 10 MG PO TB24: 10.0000 mg | ORAL_TABLET | Freq: Every day | ORAL | 3 refills | Status: DC

## 2021-01-31 NOTE — Addendum Note (Signed)
Addended by: Phillips Odor on: 01/31/2021 08:28 AM   Modules accepted: Orders

## 2021-01-31 NOTE — Addendum Note (Signed)
Addended by: Phillips Odor on: 01/31/2021 12:29 PM   Modules accepted: Orders

## 2021-01-31 NOTE — Telephone Encounter (Signed)
Patient returned call and made aware.   States that he is not sure if he is taking both medications.   Will call pharmacy to inquire.   Metformin D/C'ed.  Glipizide sent to pharmacy.

## 2021-01-31 NOTE — Telephone Encounter (Signed)
Call placed to pharmacy.   Pharmacy advised that insurance wold not cover [place]. States that patient is filling prescription for Jardiance.   Call placed to patient and patient made aware. Advised that he should only be taking Jardiance and Glipizide for DM.

## 2021-02-12 ENCOUNTER — Ambulatory Visit: Payer: 59 | Admitting: Cardiology

## 2021-02-12 ENCOUNTER — Telehealth: Payer: Self-pay | Admitting: *Deleted

## 2021-02-12 MED ORDER — NIRMATRELVIR/RITONAVIR (PAXLOVID)TABLET: 3.0000 | ORAL_TABLET | Freq: Two times a day (BID) | ORAL | 0 refills | Status: AC

## 2021-02-12 NOTE — Telephone Encounter (Signed)
Patient tested positive for COVID on 02/12/2021.   Sx began 02/11/2021 and include chills, sweats, and body aches.  Advised to continue symptom management with OTC medications: Tylenol/ Ibuprofen for fever/ body aches, Mucinex/ Delsym for cough/ chest congestion, Afrin/Sudafed/nasal saline for sinus pressure/ nasal congestion.  GFR 110. Order for Paxlovid sent to pharmacy. Advised possible SE include nausea, diarrhea, loss of taste and hypertension.  If chest pain, shortness of breath, fever >104 that is unresponsive to antipyretics noted, or if unable to tolerate fluids, advised to go to ER for evaluation.   Advised that the CDC recommends the following criteria prior to ending isolation in vaccinated and unvaccinated persons: at least 5 days since symptoms onset, then an additional 5 days of masking  AND 3 days fever free without antipyretics (Tylenol or Ibuprofen) AND improvement in respiratory symptoms.

## 2021-02-19 IMAGING — CT CT RENAL STONE PROTOCOL
2 of 4 series · 17 of 46 positions shown, 19 images · non-contrast
Comparison: Radiograph 12/08/2018

CLINICAL DATA: Right-sided flank pain

EXAM:
CT ABDOMEN AND PELVIS WITHOUT CONTRAST
TECHNIQUE: Multidetector CT imaging of the abdomen and pelvis was performed
following the standard protocol without IV contrast.

[Series 3: stone study 5.0 i30f 2 · axial · 0.98mm/px · z∈[+787,+1227]mm · 14 of 98 slices shown, 16 images]
[im 5/98  soft-tissue]
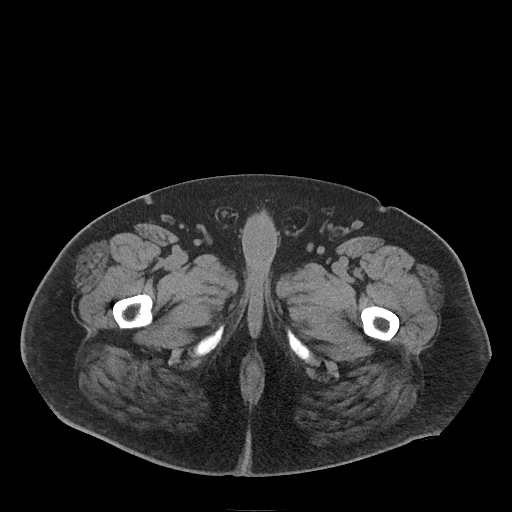
[im 5/98  bone]
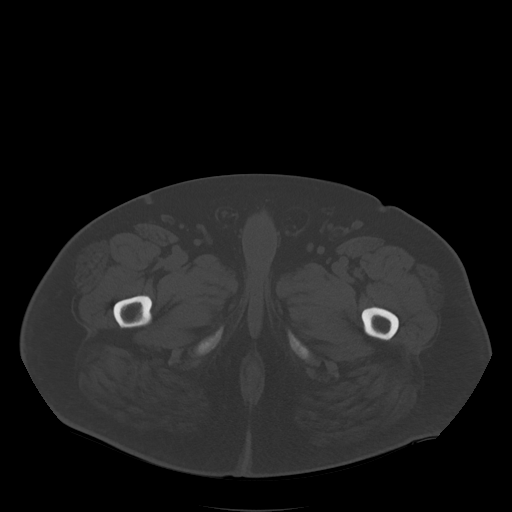
[im 13/98  soft-tissue]
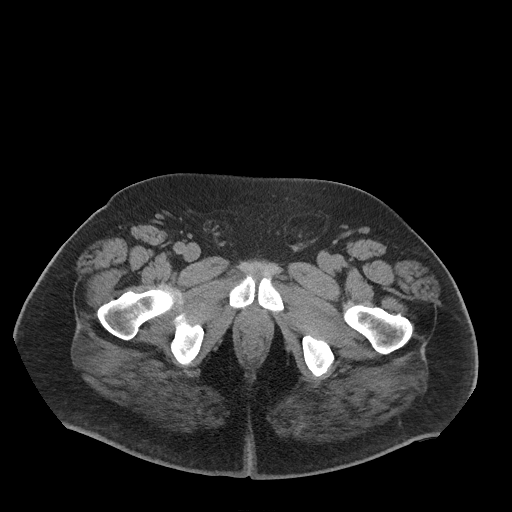
[im 21/98  soft-tissue]
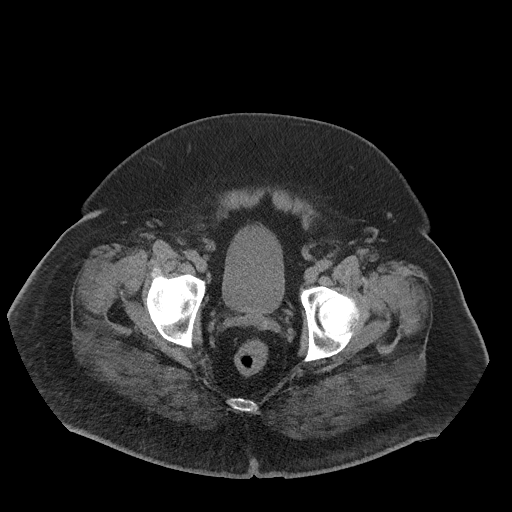
[im 25/98  soft-tissue]
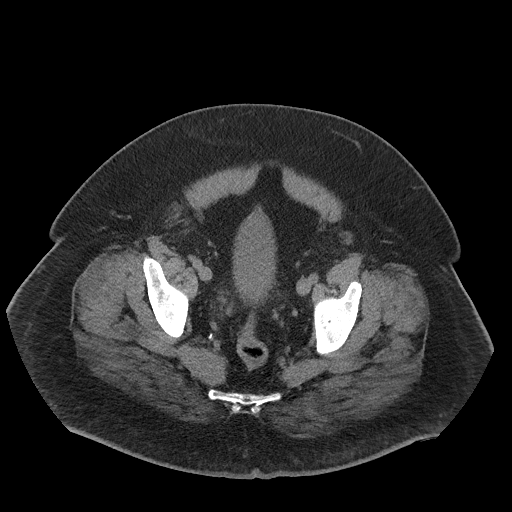
[im 33/98  soft-tissue]
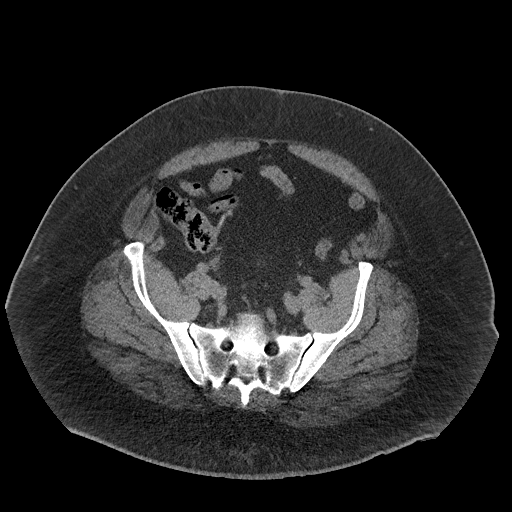
[im 41/98  soft-tissue]
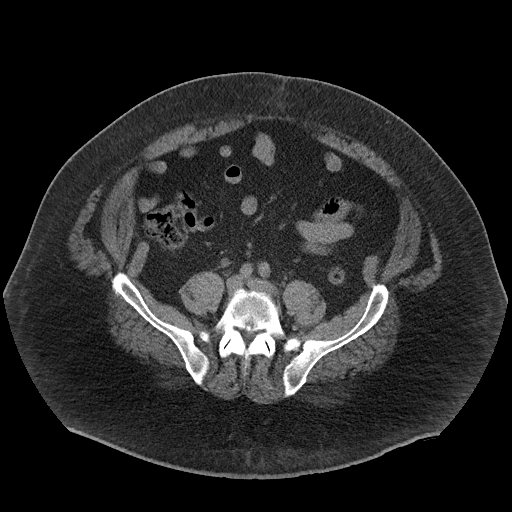
[im 45/98  soft-tissue]
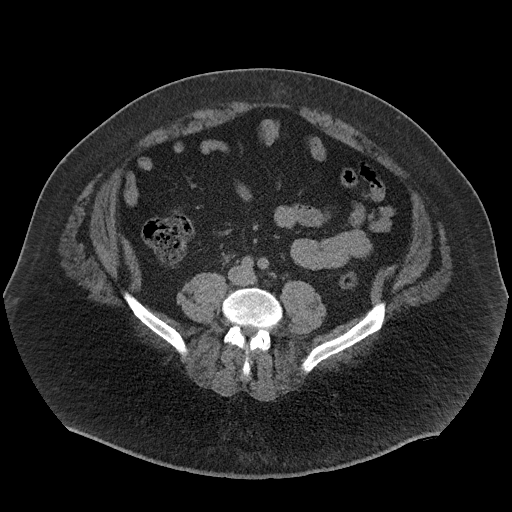
[im 53/98  soft-tissue]
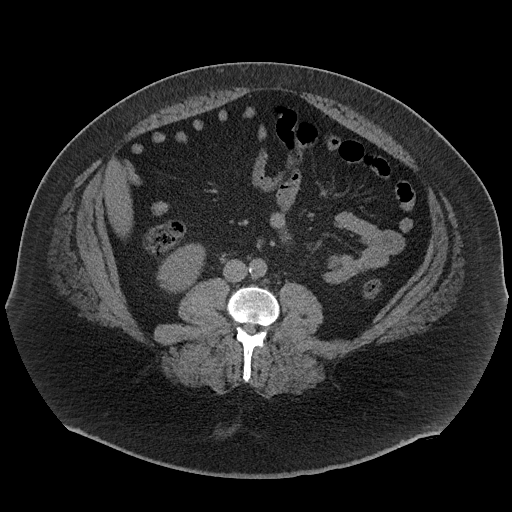
[im 57/98  soft-tissue]
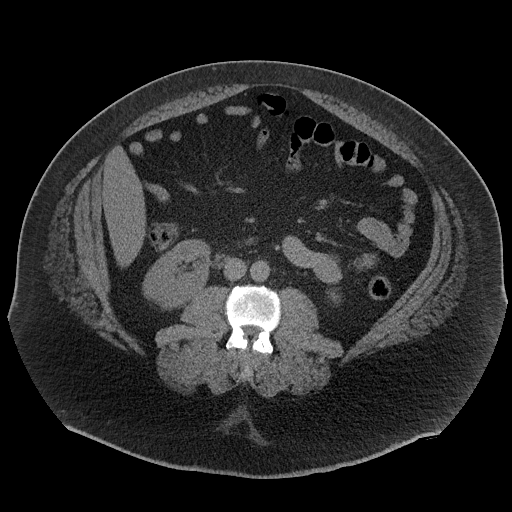
[im 57/98  bone]
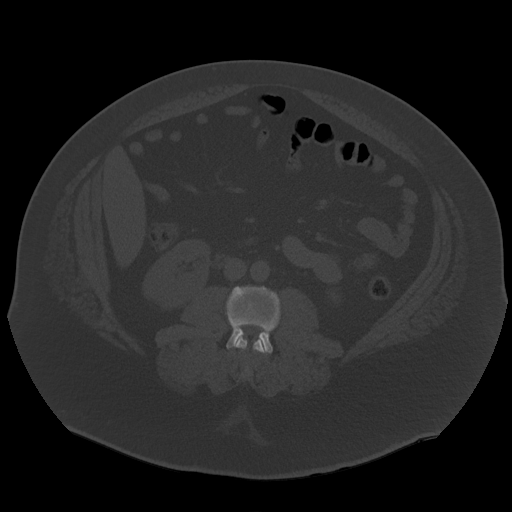
[im 65/98  soft-tissue]
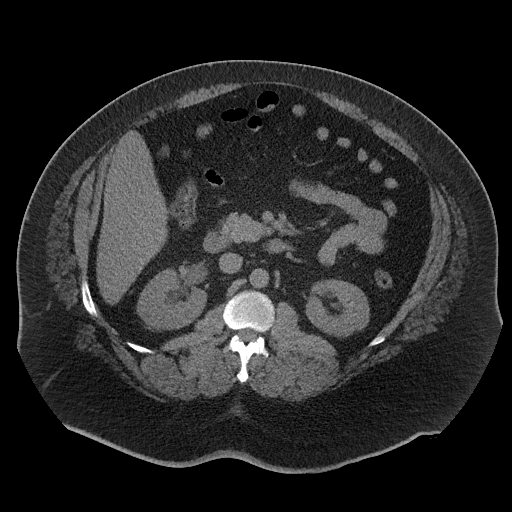
[im 73/98  soft-tissue]
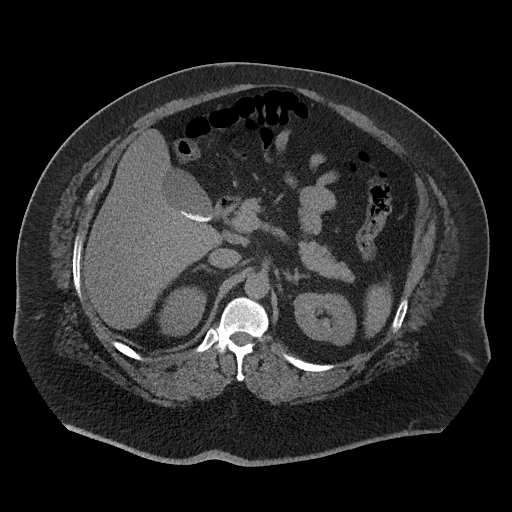
[im 77/98  soft-tissue]
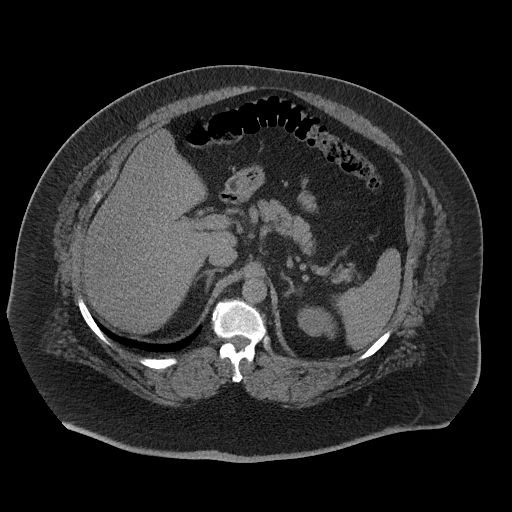
[im 85/98  soft-tissue]
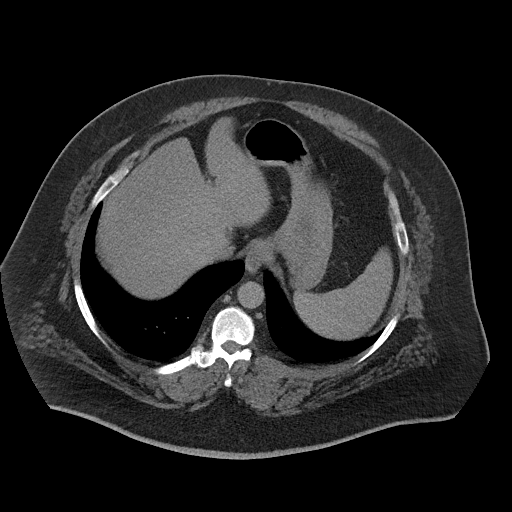
[im 93/98  soft-tissue]
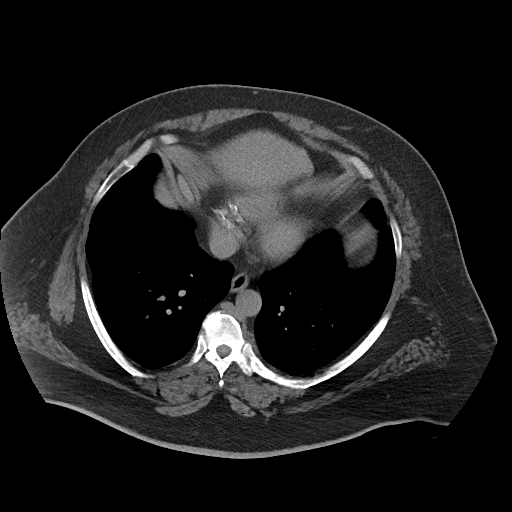

[Series 6: coronal soft tissue · coronal · 0.98mm/px · 3 of 117 slices shown]
[im 39/117  soft-tissue]
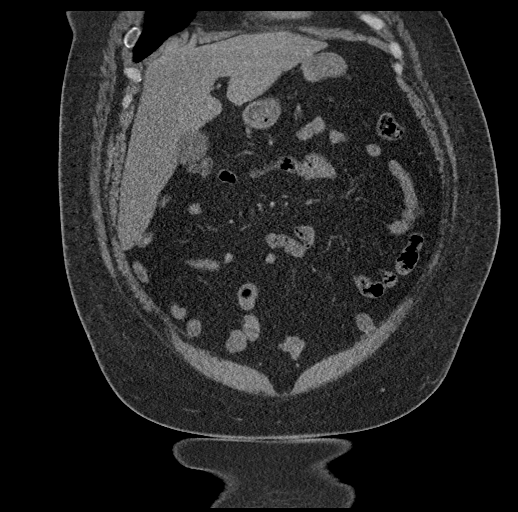
[im 52/117  soft-tissue]
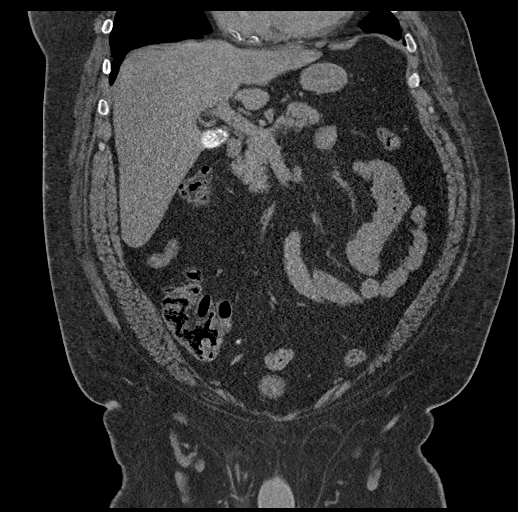
[im 65/117  soft-tissue]
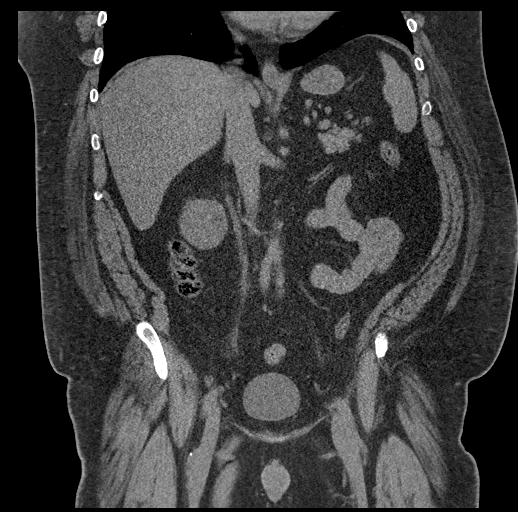

[17 of 46 positions shown; findings below may reference images not displayed]

FINDINGS: Lower chest: Lung bases demonstrate no acute consolidation or
effusion. Coronary vascular calcifications.

Hepatobiliary: Multiple calcified gallstones. No focal hepatic
abnormality or biliary dilatation

Pancreas: Unremarkable. No pancreatic ductal dilatation or
surrounding inflammatory changes.

Spleen: Normal in size without focal abnormality.

Adrenals/Urinary Tract: Adrenal glands are normal. Mild right
hydronephrosis and hydroureter. Punctate 2 mm stone at or just past
the right UVJ the posterior aspect of the bladder. Left kidney is
within normal limits.

Stomach/Bowel: Stomach is within normal limits. Appendix appears
normal. No evidence of bowel wall thickening, distention, or
inflammatory changes.

Vascular/Lymphatic: Mild aortic atherosclerosis. No aneurysm. No
suspicious nodes

Reproductive: Prostate is unremarkable.

Other: Negative for free air or free fluid. Small fat containing
inguinal hernia. Small fat containing umbilical hernia

Musculoskeletal: No acute or significant osseous findings.
IMPRESSION: 1. Mild right hydronephrosis and hydroureter. Punctate 2 mm stone at
the right posterior bladder consistent with recently passed or
impending passage of stone.
2. Gallstones.

Aortic Atherosclerosis ([id number]).

## 2021-02-26 ENCOUNTER — Other Ambulatory Visit: Payer: Self-pay

## 2021-02-26 ENCOUNTER — Encounter: Payer: Self-pay | Admitting: Cardiology

## 2021-02-26 ENCOUNTER — Ambulatory Visit: Payer: 59 | Admitting: Cardiology

## 2021-02-26 VITALS — BP 139/80 | HR 98 | Temp 98.0°F | Resp 17 | Ht 69.0 in | Wt 325.0 lb

## 2021-02-26 DIAGNOSIS — I2510 Atherosclerotic heart disease of native coronary artery without angina pectoris: Secondary | ICD-10-CM

## 2021-02-26 DIAGNOSIS — E119 Type 2 diabetes mellitus without complications: Secondary | ICD-10-CM

## 2021-02-26 DIAGNOSIS — I10 Essential (primary) hypertension: Secondary | ICD-10-CM

## 2021-02-26 DIAGNOSIS — Z955 Presence of coronary angioplasty implant and graft: Secondary | ICD-10-CM

## 2021-02-26 DIAGNOSIS — E782 Mixed hyperlipidemia: Secondary | ICD-10-CM

## 2021-02-26 DIAGNOSIS — Z720 Tobacco use: Secondary | ICD-10-CM

## 2021-02-26 DIAGNOSIS — R0609 Other forms of dyspnea: Secondary | ICD-10-CM

## 2021-02-26 MED ORDER — METOPROLOL SUCCINATE ER 25 MG PO TB24: 25.0000 mg | ORAL_TABLET | Freq: Every day | ORAL | 0 refills | Status: DC

## 2021-02-26 NOTE — Progress Notes (Signed)
Manuel Carey Date of Birth: 1971-02-25 MRN: [id number] Primary Care Provider:Pickard, Cammie Mcgee, MD Former Cardiology Providers: Jeri Lager, APRN, FNP-C, Dr. Adrian Prows Primary Cardiologist: Rex Kras, DO, [facility] (established care 01/06/2020)  Date: 02/26/21 Last Office Visit: 01/06/2020   Chief Complaint  Patient presents with   Follow-up    6 MONTH   Coronary Artery Disease    HPI  Manuel Carey is a 50 y.o.  male who presents to the office with a chief complaint of " annual follow-up visit for heart disease." Patient's past medical history and cardiovascular risk factors include: Hx of COVID (01/2021), hypertension, hyperlipidemia, ongoing tobacco use, diabetes,  CAD and NSTEMI on 12/03/2013 S/P  3 x 33 mm Xience DES proximal LAD on  12/06/2013.   Patient presents for 1 year follow-up for management of coronary artery disease.  Over the last 1 year patient states that she is doing well from a cardiovascular standpoint and has not experienced chest pain at rest or with effort related activities.  Shortness of breath with effort related activities remains relatively stable.  No use of sublingual nitroglycerin tablet since last office encounter.  No hospitalizations or urgent care visits for cardiovascular symptoms.  Since last office visit patient has started treatment for diabetes and his most recent hemoglobin A1c has trended upward.  Currently being managed by primary team.  Patient is also been focusing on lifestyle changes and has lost 11 pounds.  Unfortunately, he continues to smoke half pack per day.  Patient states that he is in the process of completely stopping given his cardiovascular risk factors and history.  ALLERGIES: No Known Allergies   MEDICATION LIST PRIOR TO VISIT: Current Outpatient Medications on File Prior to Visit  Medication Sig Dispense Refill   aspirin 81 MG chewable tablet Chew 1 tablet (81 mg total) by mouth daily.     atorvastatin (LIPITOR)  80 MG tablet TAKE 1 TABLET BY MOUTH  DAILY 90 tablet 3   empagliflozin (JARDIANCE) 25 MG TABS tablet Take 1 tablet (25 mg total) by mouth daily before breakfast. 30 tablet 5   glipiZIDE (GLUCOTROL XL) 10 MG 24 hr tablet Take 1 tablet (10 mg total) by mouth daily with breakfast. 90 tablet 3   losartan (COZAAR) 25 MG tablet Take 1 tablet (25 mg total) by mouth daily. 90 tablet 0   nitroGLYCERIN (NITROSTAT) 0.4 MG SL tablet Place 1 tablet (0.4 mg total) under the tongue every 5 (five) minutes as needed for chest pain. 25 tablet 3   No current facility-administered medications on file prior to visit.    PAST MEDICAL HISTORY: Past Medical History:  Diagnosis Date   Diabetes mellitus type 2 with complications ([place])    Environmental allergies    Heart attack ([place])    2015, DES x 1, xience stent proximal LAD   HTN (hypertension)    Hyperlipemia     PAST SURGICAL HISTORY: Past Surgical History:  Procedure Laterality Date   KNEE ARTHROSCOPY W/ MENISCECTOMY     left knee   LEFT HEART CATHETERIZATION WITH CORONARY ANGIOGRAM N/A 12/06/2013   Procedure: LEFT HEART CATHETERIZATION WITH CORONARY ANGIOGRAM;  Surgeon: Laverda Page, MD;  Location: [facility] CATH LAB;  Service: Cardiovascular;  Laterality: N/A;   PERCUTANEOUS STENT INTERVENTION  12/06/2013   Procedure: PERCUTANEOUS STENT INTERVENTION;  Surgeon: Laverda Page, MD;  Location: [facility] CATH LAB;  Service: Cardiovascular;;    FAMILY HISTORY: The patient's family history includes Cancer in his mother; Diabetes in  his father; Hyperlipidemia in his father; Hypertension in his father.   SOCIAL HISTORY:  The patient  reports that he has been smoking cigarettes. He has a 12.50 pack-year smoking history. He has never used smokeless tobacco. He reports that he does not currently use alcohol. He reports that he does not use drugs.  Review of Systems  Constitutional: Negative for chills and fever.  HENT:  Negative for hoarse voice and nosebleeds.    Eyes:  Negative for discharge, double vision and pain.  Cardiovascular:  Positive for dyspnea on exertion (chronic and stable.). Negative for chest pain, claudication, leg swelling, near-syncope, orthopnea, palpitations, paroxysmal nocturnal dyspnea and syncope. Cyanosis: chronic and stable.Marland Kitchen Respiratory:  Positive for snoring. Negative for hemoptysis and shortness of breath.   Musculoskeletal:  Negative for muscle cramps and myalgias.  Gastrointestinal:  Negative for abdominal pain, constipation, diarrhea, hematemesis, hematochezia, melena, nausea and vomiting.  Neurological:  Negative for dizziness and light-headedness.   PHYSICAL EXAM: Vitals with BMI 02/26/2021 12/25/2020 01/06/2020  Height 5' 9" 5' 9" -  Weight 325 lbs 339 lbs -  BMI 61.44 31.54 -  Systolic 008 676 195  Diastolic 80 80 90  Pulse 98 62 60    CONSTITUTIONAL: Well-developed and well-nourished. No acute distress.  SKIN: Skin is warm and dry. No rash noted. No cyanosis. No pallor. No jaundice HEAD: Normocephalic and atraumatic.  EYES: No scleral icterus MOUTH/THROAT: Moist oral membranes.  NECK: Neck is short, significant soft tissue, unable to evaluate JVP, no carotid bruits  LYMPHATIC: No visible cervical adenopathy.  CHEST Normal respiratory effort. No intercostal retractions  LUNGS: Clear to auscultation bilaterally.  No stridor. No wheezes. No rales.  CARDIOVASCULAR: Regular, positive S1-S2, no murmurs rubs or gallops appreciated. ABDOMINAL: Obese, soft, nontenderness nondistended, positive bowel sounds in all 4 quadrants, no apparent ascites.  EXTREMITIES: trace bilateral peripheral edema  HEMATOLOGIC: No significant bruising NEUROLOGIC: Oriented to person, place, and time. Nonfocal. Normal muscle tone.  PSYCHIATRIC: Normal mood and affect. Normal behavior. Cooperative  RADIOLOGY: CT renal stone study 12/30/2019: Aortic atherosclerosis.  CARDIAC DATABASE: EKG: 02/26/2021: Normal sinus rhythm, 62 bpm, without  underlying ischemia or injury pattern  Echocardiogram: 12/16/2018 :  Normal LV systolic function with visual EF 55-60%. Left ventricle cavity is normal in size. Mild concentric hypertrophy of the left ventricle. Normal global wall motion. Normal diastolic filling pattern. No significant valvular abnormality.  Stress Testing:  Treadmill stress test  [12/24/2013]: Indications: Assessment of Chest Pain. Conclusions: Negative for ischemia. Continue primary prevention The patient exercised according to the Bruce protocol, Total time recorded 9 Min. 0 sec. achieving a max heart rate of 164 which was 93% of MPHR for age and 10.0 METS of work. Baseline NIBP was 122/74. Peak NIBP was 122/74 MaxSysp was: 122 MaxDiasp was: 74.The baseline ECG showed NSR,LAD, LAFB. During exercise there was no ST-T changes of ischemia. Symptoms: THR met. Mild dyspnea. Arrhythmia: Rare PAC  Heart Catheterization: Coronary Angiogram 12/03/2013: S/P 3 x 33 mm Xience DES proximal LAD on 12/06/2013. Mild disease in other vessels and mild ectasion in distal RCA.  LABORATORY DATA: CBC Latest Ref Rng & Units 01/01/2021 11/13/2019 04/06/2019  WBC 3.8 - 10.8 Thousand/uL 7.2 9.9 8.0  Hemoglobin 13.2 - 17.1 g/dL 13.8 14.3 13.7  Hematocrit 38.5 - 50.0 % 41.3 43.6 41.8  Platelets 140 - 400 Thousand/uL 183 205 219    CMP Latest Ref Rng & Units 01/01/2021 01/25/2020 11/13/2019  Glucose 65 - 99 mg/dL 199(H) 156(H) 137(H)  BUN 7 -  25 mg/dL _0 Creatinine 0.70 - 1.30 mg/dL 0.74 0.78 0.93  Sodium 135 - 146 mmol/L 141 140 136  Potassium 3.5 - 5.3 mmol/L 3.9 4.7 3.7  Chloride 98 - 110 mmol/L 106 102 104  CO2 20 - 32 mmol/L _1 Calcium 8.6 - 10.3 mg/dL 8.4(L) 9.2 8.8(L)  Total Protein 6.1 - 8.1 g/dL 6.0(L) - -  Total Bilirubin 0.2 - 1.2 mg/dL 0.4 - -  Alkaline Phos 39 - 117 U/L - - -  AST 10 - 35 U/L 25 - -  ALT 9 - 46 U/L 38 - -    Lipid Panel     Component Value Date/Time   CHOL 79 01/01/2021 0942   TRIG 73 01/01/2021  0942   HDL 28 (L) 01/01/2021 0942   CHOLHDL 2.8 01/01/2021 0942   VLDL 36 12/04/2013 0310   LDLCALC 36 01/01/2021 0942    Lab Results  Component Value Date   HGBA1C 9.8 (H) 01/01/2021   HGBA1C 6.6 (H) 04/06/2019   HGBA1C 5.7 (H) 12/04/2013   No components found for: NTPROBNP No results found for: TSH  Cardiac Panel (last 3 results) No results for input(s): CKTOTAL, CKMB, TROPONINIHS, RELINDX in the last 72 hours.  IMPRESSION:    ICD-10-CM   1. Atherosclerosis of native coronary artery of native heart without angina pectoris  I25.10 metoprolol succinate (TOPROL XL) 25 MG 24 hr tablet    PCV MYOCARDIAL PERFUSION WO LEXISCAN    2. History of coronary angioplasty with insertion of stent  Z95.5     3. Dyspnea on exertion  R06.09     4. Essential hypertension  I10 EKG 12-Lead    5. Non-insulin dependent type 2 diabetes mellitus ([place])  E11.9     6. Mixed hyperlipidemia  E78.2     7. Tobacco use  Z72.0        RECOMMENDATIONS: Manuel Carey is a 50 y.o. male whose past medical history and cardiovascular risk factors include: hypertension, hyperlipidemia, ongoing tobacco use, diabetes,  CAD and NSTEMI on 12/03/2013 S/P  3 x 33 mm Xience DES proximal LAD on  12/06/2013.   Atherosclerosis of native coronary artery of native heart, status post angioplasty stenting, without angina pectoris Currently chest pain-free. EKG nonischemic. Last ischemic evaluation greater than 5 years. We will schedule for an exercise nuclear stress test prior to the next office visit to reevaluate for reversible ischemia and functional status. Medications reconciled. Will transition Lopressor to Toprol-XL.  Prescription provided.  Educated on importance of secondary prevention.  Essential hypertension Office blood pressure within acceptable range..  Medication reconciled.  Importance of low-salt diet reemphasized. Currently managed by primary care provider.  Non-insulin dependent type 2 diabetes  mellitus ([place]) Most recent hemoglobin A1c had trended up from 6.6-9.8 Pharmacological therapy has been uptitrated by primary team. Currently on ARB. Currently on SGLT2 inhibitors. Currently on statin therapy. Educated on the importance of glycemic control  Mixed hyperlipidemia Currently on atorvastatin.   He denies myalgia or other side effects. Most recent lipids dated 01/01/2021 reviewed as noted above.  LDL is currently at goal, at 36 mg/dL  Tobacco use Tobacco cessation counseling: Currently smoking 0.5 packs/day   Patient is willing to quit at this time. 5 mins were spent counseling patient cessation techniques. We discussed various methods to help quit smoking, including deciding on a date to quit, joining a support group, pharmacological agents- nicotine gum/patch/lozenges.  I will reassess his progress at the next  follow-up visit  FINAL MEDICATION LIST END OF ENCOUNTER: Meds ordered this encounter  Medications   metoprolol succinate (TOPROL XL) 25 MG 24 hr tablet    Sig: Take 1 tablet (25 mg total) by mouth daily.    Dispense:  90 tablet    Refill:  0      Current Outpatient Medications:    aspirin 81 MG chewable tablet, Chew 1 tablet (81 mg total) by mouth daily., Disp: , Rfl:    atorvastatin (LIPITOR) 80 MG tablet, TAKE 1 TABLET BY MOUTH  DAILY, Disp: 90 tablet, Rfl: 3   empagliflozin (JARDIANCE) 25 MG TABS tablet, Take 1 tablet (25 mg total) by mouth daily before breakfast., Disp: 30 tablet, Rfl: 5   glipiZIDE (GLUCOTROL XL) 10 MG 24 hr tablet, Take 1 tablet (10 mg total) by mouth daily with breakfast., Disp: 90 tablet, Rfl: 3   losartan (COZAAR) 25 MG tablet, Take 1 tablet (25 mg total) by mouth daily., Disp: 90 tablet, Rfl: 0   metoprolol succinate (TOPROL XL) 25 MG 24 hr tablet, Take 1 tablet (25 mg total) by mouth daily., Disp: 90 tablet, Rfl: 0   nitroGLYCERIN (NITROSTAT) 0.4 MG SL tablet, Place 1 tablet (0.4 mg total) under the tongue every 5 (five) minutes as  needed for chest pain., Disp: 25 tablet, Rfl: 3  Orders Placed This Encounter  Procedures   PCV MYOCARDIAL PERFUSION WO LEXISCAN   EKG 12-Lead   --Continue cardiac medications as reconciled in final medication list. --Return in about 6 months (around 08/16/2021) for Follow up CAD review stress test . Or sooner if needed. --Continue follow-up with your primary care physician regarding the management of your other chronic comorbid conditions.  Patient's questions and concerns were addressed to his satisfaction. He voices understanding of the instructions provided during this encounter.   This note was created using a voice recognition software as a result there may be grammatical errors inadvertently enclosed that do not reflect the nature of this encounter. Every attempt is made to correct such errors.  Rex Kras, [place], [facility]  Pager: [phone] Office: [phone]

## 2021-03-26 ENCOUNTER — Other Ambulatory Visit: Payer: Self-pay | Admitting: *Deleted

## 2021-03-26 MED ORDER — GLIPIZIDE ER 10 MG PO TB24: 10.0000 mg | ORAL_TABLET | Freq: Every day | ORAL | 3 refills | Status: DC

## 2021-03-27 ENCOUNTER — Other Ambulatory Visit: Payer: Self-pay

## 2021-03-27 MED ORDER — LOSARTAN POTASSIUM 25 MG PO TABS: 25.0000 mg | ORAL_TABLET | Freq: Every day | ORAL | 1 refills | Status: DC

## 2021-04-19 ENCOUNTER — Other Ambulatory Visit: Payer: Self-pay | Admitting: Cardiology

## 2021-06-03 ENCOUNTER — Other Ambulatory Visit: Payer: Self-pay | Admitting: Cardiology

## 2021-06-03 DIAGNOSIS — I2510 Atherosclerotic heart disease of native coronary artery without angina pectoris: Secondary | ICD-10-CM

## 2021-06-27 ENCOUNTER — Other Ambulatory Visit: Payer: Self-pay

## 2021-06-27 MED ORDER — EMPAGLIFLOZIN 25 MG PO TABS: 25.0000 mg | ORAL_TABLET | Freq: Every day | ORAL | 3 refills | Status: DC

## 2021-07-27 ENCOUNTER — Other Ambulatory Visit: Payer: Self-pay | Admitting: Cardiology

## 2021-08-06 ENCOUNTER — Other Ambulatory Visit: Payer: 59

## 2021-08-22 ENCOUNTER — Other Ambulatory Visit: Payer: 59

## 2021-08-23 ENCOUNTER — Ambulatory Visit: Payer: 59 | Admitting: Cardiology

## 2021-09-03 ENCOUNTER — Ambulatory Visit: Payer: 59 | Admitting: Cardiology

## 2021-09-06 ENCOUNTER — Other Ambulatory Visit: Payer: Self-pay | Admitting: Cardiology

## 2021-09-22 ENCOUNTER — Other Ambulatory Visit: Payer: Self-pay | Admitting: Cardiology

## 2021-09-22 DIAGNOSIS — I2510 Atherosclerotic heart disease of native coronary artery without angina pectoris: Secondary | ICD-10-CM

## 2021-10-15 ENCOUNTER — Emergency Department (HOSPITAL_COMMUNITY): Payer: 59

## 2021-10-15 ENCOUNTER — Other Ambulatory Visit: Payer: Self-pay

## 2021-10-15 ENCOUNTER — Emergency Department (HOSPITAL_COMMUNITY)
Admission: EM | Admit: 2021-10-15 | Discharge: 2021-10-15 | Disposition: A | Discharge status: Home / Self Care | Payer: 59 | Attending: Emergency Medicine | Admitting: Emergency Medicine

## 2021-10-15 ENCOUNTER — Encounter (HOSPITAL_COMMUNITY): Payer: Self-pay | Admitting: Emergency Medicine

## 2021-10-15 DIAGNOSIS — Z7984 Long term (current) use of oral hypoglycemic drugs: Secondary | ICD-10-CM | POA: Diagnosis not present

## 2021-10-15 DIAGNOSIS — R1031 Right lower quadrant pain: Secondary | ICD-10-CM | POA: Diagnosis present

## 2021-10-15 DIAGNOSIS — E119 Type 2 diabetes mellitus without complications: Secondary | ICD-10-CM | POA: Insufficient documentation

## 2021-10-15 DIAGNOSIS — Z955 Presence of coronary angioplasty implant and graft: Secondary | ICD-10-CM | POA: Diagnosis not present

## 2021-10-15 DIAGNOSIS — Z7982 Long term (current) use of aspirin: Secondary | ICD-10-CM | POA: Insufficient documentation

## 2021-10-15 DIAGNOSIS — I2510 Atherosclerotic heart disease of native coronary artery without angina pectoris: Secondary | ICD-10-CM | POA: Insufficient documentation

## 2021-10-15 DIAGNOSIS — R1084 Generalized abdominal pain: Secondary | ICD-10-CM

## 2021-10-15 LAB — URINALYSIS, ROUTINE W REFLEX MICROSCOPIC
Bacteria, UA: NONE SEEN
Bilirubin Urine: NEGATIVE
Glucose, UA: >=500 mg/dL — AB
Hgb urine dipstick: NEGATIVE
Ketones, ur: NEGATIVE mg/dL
Leukocytes,Ua: NEGATIVE
Nitrite: NEGATIVE
Protein, ur: NEGATIVE mg/dL
Specific Gravity, Urine: 1.03 (ref 1.005–1.030)
pH: 5 (ref 5.0–8.0)

## 2021-10-15 LAB — CBC
HCT: 47.3 % (ref 39.0–52.0)
Hemoglobin: 15.4 g/dL (ref 13.0–17.0)
MCH: 30.5 pg (ref 26.0–34.0)
MCHC: 32.6 g/dL (ref 30.0–36.0)
MCV: 93.7 fL (ref 80.0–100.0)
Platelets: 203 10*3/uL (ref 150–400)
RBC: 5.05 MIL/uL (ref 4.22–5.81)
RDW: 13.2 % (ref 11.5–15.5)
WBC: 11.2 10*3/uL — ABNORMAL HIGH (ref 4.0–10.5)
nRBC: 0 % (ref 0.0–0.2)

## 2021-10-15 LAB — COMPREHENSIVE METABOLIC PANEL
ALT: 25 U/L (ref 0–44)
AST: 18 U/L (ref 15–41)
Albumin: 3.6 g/dL (ref 3.5–5.0)
Alkaline Phosphatase: 110 U/L (ref 38–126)
Anion gap: 6 (ref 5–15)
BUN: 15 mg/dL (ref 6–20)
CO2: 25 mmol/L (ref 22–32)
Calcium: 8.8 mg/dL — ABNORMAL LOW (ref 8.9–10.3)
Chloride: 106 mmol/L (ref 98–111)
Creatinine, Ser: 1 mg/dL (ref 0.61–1.24)
GFR, Estimated: >60 mL/min (ref >60)
Glucose, Bld: 173 mg/dL — ABNORMAL HIGH (ref 70–99)
Potassium: 4 mmol/L (ref 3.5–5.1)
Sodium: 137 mmol/L (ref 135–145)
Total Bilirubin: 0.4 mg/dL (ref 0.3–1.2)
Total Protein: 6.2 g/dL — ABNORMAL LOW (ref 6.5–8.1)

## 2021-10-15 LAB — LIPASE, BLOOD: Lipase: 31 U/L (ref 11–51)

## 2021-10-15 MED ORDER — NAPROXEN 375 MG PO TABS: 375.0000 mg | ORAL_TABLET | Freq: Two times a day (BID) | ORAL | 0 refills | Status: DC

## 2021-10-15 MED ORDER — IOHEXOL 300 MG/ML  SOLN
100.0000 mL | Freq: Once | INTRAMUSCULAR | Status: AC | PRN
  Administered 2021-10-15: 100 mL via INTRAVENOUS

## 2021-10-15 MED ORDER — LIDOCAINE 5 % EX PTCH
1.0000 | MEDICATED_PATCH | CUTANEOUS | Status: DC
Start: 2021-10-15 — End: 2021-10-15

## 2021-10-15 NOTE — ED Provider Notes (Signed)
  Physical Exam  BP (!) 146/88 (BP Location: Right Arm)   Pulse 61   Temp 98.2 F (36.8 C)   Resp 20   SpO2 96%   Physical Exam Vitals and nursing note reviewed.  Constitutional:      General: He is not in acute distress.    Appearance: Normal appearance.  HENT:     Head: Normocephalic and atraumatic.  Eyes:     General:        Right eye: No discharge.        Left eye: No discharge.  Cardiovascular:     Comments: Regular rate and rhythm.  S1/S2 are distinct without any evidence of murmur, rubs, or gallops.  Radial pulses are 2+ bilaterally.  Dorsalis pedis pulses are 2+ bilaterally.  No evidence of pedal edema. Pulmonary:     Comments: Clear to auscultation bilaterally.  Normal effort.  No respiratory distress.  No evidence of wheezes, rales, or rhonchi heard throughout. Abdominal:     General: Abdomen is flat. Bowel sounds are normal. There is no distension.     Tenderness: There is no abdominal tenderness. There is no guarding or rebound.  Musculoskeletal:        General: Normal range of motion.     Cervical back: Neck supple.  Skin:    General: Skin is warm and dry.     Findings: No rash.  Neurological:     General: No focal deficit present.     Mental Status: He is alert.  Psychiatric:        Mood and Affect: Mood normal.        Behavior: Behavior normal.    Procedures  Procedures  ED Course / MDM    Medical Decision Making Amount and/or Complexity of Data Reviewed Labs: ordered. Radiology: ordered.  Risk Prescription drug management.   Accepted handoff at shift change from [place] PA-C. Please see prior provider note for more detail.   Briefly: Patient is 51 y.o. male with hx of CAD and NSTEMI s/p stent placement in 2015 who presents to the emergency department today with right lower quadrant abdominal pain that started yesterday afternoon.  This came out of nowhere and there was no inciting event.  Patient states it is worsening throughout the day.   Denies nausea, vomiting, diarrhea, testicular pain, fever, chills.  Patient does have a history of nephrolithiasis on the right and states this pain feels different.  Plan: Abdominal pain is worse with movement.  No specific focal tenderness over the right lower quadrant.  I personally reviewed the CT abdomen pelvis that was ordered by [place] PA-C.  No acute abnormalities in the abdomen or pelvis.  I discussed the findings with the patient at the bedside.  We will give him naproxen and lidocaine patches to go home with.  Strict return precautions were discussed with him at the bedside.  He expressed full understanding.  All questions or concerns addressed.  He is safer discharge.             Honor Loh Success, PA-C 10/15/21 0720    Nira Conn, MD 10/15/21 [phone]

## 2021-10-15 NOTE — ED Notes (Signed)
The pt left without her instructions whenever the nurse was going to get them  she aslso did not get the med ordered

## 2021-10-15 NOTE — ED Provider Notes (Signed)
[facility] EMERGENCY DEPARTMENT Provider Note   CSN: [id number] Arrival date & time: 10/15/21  0434     History  Chief Complaint  Patient presents with   Abdominal Pain    Manuel Carey is a 51 y.o. male with a past medical history of diabetes, CAD and NSTEMI status post stent placement in 2015 presenting with abdominal pain.  Reports that he started to have right flank/lower quadrant abdominal pain yesterday in the afternoon.  Came on out of nowhere.  Was worsening throughout the day.  He did not take any medications but tried to sleep it off last night and he woke up with pain.  Denies NVD, testicular pain, fevers or chills.  History of right-sided nephrolithiasis in 2021, says this does not feel similar.  Reports he is not regularly checking his blood sugars at home.  No current polydipsia or urinary frequency.   Abdominal Pain Associated symptoms: no chest pain, no constipation, no diarrhea, no dysuria, no nausea, no shortness of breath and no vomiting       Home Medications Prior to Admission medications   Medication Sig Start Date End Date Taking? Authorizing Provider  aspirin 81 MG chewable tablet Chew 1 tablet (81 mg total) by mouth daily. 12/07/13   Adrian Prows, MD  atorvastatin (LIPITOR) 80 MG tablet TAKE 1 TABLET BY MOUTH  DAILY 07/30/21   Tolia, Sunit, DO  empagliflozin (JARDIANCE) 25 MG TABS tablet Take 1 tablet (25 mg total) by mouth daily before breakfast. 06/27/21   Susy Frizzle, MD  glipiZIDE (GLUCOTROL XL) 10 MG 24 hr tablet Take 1 tablet (10 mg total) by mouth daily with breakfast. 03/26/21   Susy Frizzle, MD  losartan (COZAAR) 25 MG tablet TAKE 1 TABLET BY MOUTH DAILY 09/06/21   Tolia, Sunit, DO  metoprolol succinate (TOPROL-XL) 25 MG 24 hr tablet TAKE 1 TABLET (25 MG TOTAL) BY MOUTH DAILY. 09/24/21 12/23/21  Tolia, Sunit, DO  nitroGLYCERIN (NITROSTAT) 0.4 MG SL tablet Place 1 tablet (0.4 mg total) under the tongue every 5 (five) minutes as  needed for chest pain. 11/15/19   Rex Kras, DO      Allergies    Patient has no known allergies.    Review of Systems   Review of Systems  Constitutional:  Negative for appetite change.  Respiratory:  Negative for chest tightness and shortness of breath.   Cardiovascular:  Negative for chest pain.  Gastrointestinal:  Positive for abdominal pain. Negative for constipation, diarrhea, nausea and vomiting.  Genitourinary:  Negative for dysuria and testicular pain.   Physical Exam Updated Vital Signs BP (!) 184/99 (BP Location: Right Arm)   Pulse 74   Temp 98 F (36.7 C) (Oral)   Resp 20   SpO2 95%  Physical Exam Vitals and nursing note reviewed.  Constitutional:      General: He is not in acute distress.    Appearance: Normal appearance. He is obese.  HENT:     Head: Normocephalic and atraumatic.  Eyes:     General: No scleral icterus.    Conjunctiva/sclera: Conjunctivae normal.  Cardiovascular:     Rate and Rhythm: Normal rate and regular rhythm.  Pulmonary:     Effort: Pulmonary effort is normal. No respiratory distress.  Abdominal:     General: Abdomen is flat.     Palpations: Abdomen is soft.     Tenderness: There is no right CVA tenderness or left CVA tenderness.     Hernia: No  hernia is present.  Skin:    General: Skin is warm and dry.     Findings: No rash.  Neurological:     Mental Status: He is alert.  Psychiatric:        Mood and Affect: Mood normal.    ED Results / Procedures / Treatments   Labs (all labs ordered are listed, but only abnormal results are displayed) Labs Reviewed  COMPREHENSIVE METABOLIC PANEL - Abnormal; Notable for the following components:      Result Value   Glucose, Bld 173 (*)    Calcium 8.8 (*)    Total Protein 6.2 (*)    All other components within normal limits  CBC - Abnormal; Notable for the following components:   WBC 11.2 (*)    All other components within normal limits  URINALYSIS, ROUTINE W REFLEX MICROSCOPIC -  Abnormal; Notable for the following components:   Color, Urine STRAW (*)    Glucose, UA >=500 (*)    All other components within normal limits  LIPASE, BLOOD    EKG None  Radiology No results found.  Procedures Procedures   Medications Ordered in ED Medications - No data to display  ED Course/ Medical Decision Making/ A&P                           Medical Decision Making Amount and/or Complexity of Data Reviewed Labs: ordered. Radiology: ordered.   This patient presents to the ED for concern of abd pain. The differential diagnosis for generalized abdominal pain includes, but is not limited to AAA, gastroenteritis, appendicitis, Bowel obstruction, Bowel perforation. Gastroparesis, DKA, Hernia, Inflammatory bowel disease, mesenteric ischemia, pancreatitis, peritonitis SBP, volvulus.    This is not an exhaustive differential.    Past Medical History / Co-morbidities / Social History: T2DM, CAD   Additional history: Additional history obtained from internal chart review.  Patient with 2 mm punctuate stone on the right side in 2021.  At that time there was suspicion for previously passed larger stone.   Physical Exam: Physical exam performed. The pertinent findings include: CVA tenderness.  Difficulty eliciting tenderness, finally elicited with patient rotating trunk to the left and right lower quadrant palpation.  Lab Tests: I ordered, and personally interpreted labs.  The pertinent results include:  -Glucosuria   Imaging Studies: I ordered imaging studies including CTAP. Pending at this time   Medications: Currently asymptomatic.  Denies need for medications at this time.  Disposition: CTAP pending at shift change. Patient signed out to oncoming PA [place]. See his note for ultimate results and dispo. I suspect patient will be discharged home with MSK treatment if CT is negative.     Darliss Ridgel 10/15/21 [place],  MD 10/15/21 [phone]

## 2021-10-15 NOTE — Discharge Instructions (Signed)
Please take prescriptions as prescribed.  Follow-up with your PCP for further evaluation.  Return to the emergency department for any worsening symptoms.

## 2021-10-15 NOTE — ED Triage Notes (Signed)
Pt reported to ED with c/o RLQ abdominal pain that started yesterday afternoon that has progressively worsened. Denies any N/V/D. Has hx of kidney stones and states "pain does not feel the same". Pain worsens with movement.

## 2021-11-26 ENCOUNTER — Other Ambulatory Visit: Payer: Self-pay | Admitting: Cardiology

## 2021-12-26 ENCOUNTER — Other Ambulatory Visit: Payer: Self-pay | Admitting: Cardiology

## 2021-12-26 DIAGNOSIS — I2510 Atherosclerotic heart disease of native coronary artery without angina pectoris: Secondary | ICD-10-CM

## 2022-02-16 ENCOUNTER — Other Ambulatory Visit: Payer: Self-pay | Admitting: Family Medicine

## 2022-03-27 ENCOUNTER — Other Ambulatory Visit: Payer: Self-pay | Admitting: Cardiology

## 2022-03-27 DIAGNOSIS — I2510 Atherosclerotic heart disease of native coronary artery without angina pectoris: Secondary | ICD-10-CM

## 2022-04-01 ENCOUNTER — Other Ambulatory Visit: Payer: Self-pay | Admitting: Cardiology

## 2022-04-01 DIAGNOSIS — I2510 Atherosclerotic heart disease of native coronary artery without angina pectoris: Secondary | ICD-10-CM

## 2022-04-29 ENCOUNTER — Other Ambulatory Visit: Payer: Self-pay | Admitting: Family Medicine

## 2022-05-29 ENCOUNTER — Other Ambulatory Visit: Payer: Self-pay | Admitting: Family Medicine

## 2022-05-29 NOTE — Telephone Encounter (Signed)
Requested medication (s) are due for refill today: yes  Requested medication (s) are on the active medication list: yes  Last refill:  04/30/22 #30 (courtesy refill)  Future visit scheduled: 05/30/22  Notes to clinic:  pt has appt tomorrow/courtesy refill given previous refill   Requested Prescriptions  Pending Prescriptions Disp Refills   JARDIANCE 25 MG TABS tablet [Pharmacy Med Name: Jardiance 25 MG Oral Tablet] 30 tablet 11    Sig: TAKE 1 [place]     Endocrinology:  Diabetes - SGLT2 Inhibitors Failed - 05/29/2022  7:39 AM      Failed - HBA1C is between 0 and 7.9 and within 180 days    Hgb A1c MFr Bld  Date Value Ref Range Status  01/01/2021 9.8 (H) <5.7 % of total Hgb Final    Comment:    For someone without known diabetes, a hemoglobin A1c value of 6.5% or greater indicates that they may have  diabetes and this should be confirmed with a follow-up  test. . For someone with known diabetes, a value <7% indicates  that their diabetes is well controlled and a value  greater than or equal to 7% indicates suboptimal  control. A1c targets should be individualized based on  duration of diabetes, age, comorbid conditions, and  other considerations. . Currently, no consensus exists regarding use of hemoglobin A1c for diagnosis of diabetes for children. .          Failed - Valid encounter within last 6 months    Recent Outpatient Visits           1 year ago Colon cancer screening   [place] Pickard, Cammie Mcgee, MD   3 years ago Hypersomnolence   [place] Pickard, Cammie Mcgee, MD   3 years ago Counseled about COVID-19 virus infection   [place], Modena Nunnery, MD   3 years ago Chest pain, unspecified type   [place] Delsa Grana, PA-C   3 years ago [place], Cammie Mcgee, MD       Future Appointments             Tomorrow  Susy Frizzle, MD [place]            Passed - Cr in normal range and within 360 days    Creat  Date Value Ref Range Status  01/01/2021 0.74 0.70 - 1.30 mg/dL Final   Creatinine, Ser  Date Value Ref Range Status  10/15/2021 1.00 0.61 - 1.24 mg/dL Final         Passed - eGFR in normal range and within 360 days    GFR, Est African American  Date Value Ref Range Status  04/06/2019 114 > OR = 60 mL/min/1.59m2 Final   GFR calc Af Amer  Date Value Ref Range Status  01/25/2020 122 >59 mL/min/1.73 Final    Comment:    **[facility] currently reports eGFR in compliance with the current**   recommendations of the [employer]. [facility] will   update reporting as new guidelines are published from the NKF-ASN   Task force.    GFR, Est Non African American  Date Value Ref Range Status  04/06/2019 99 > OR = 60 mL/min/1.47m2 Final   GFR, Estimated  Date Value Ref Range Status  10/15/2021 >60 >60 mL/min Final    Comment:    (NOTE) Calculated  using the CKD-EPI Creatinine Equation (2021)    eGFR  Date Value Ref Range Status  01/01/2021 110 > OR = 60 mL/min/1.40m2 Final    Comment:    The eGFR is based on the CKD-EPI 2021 equation. To calculate  the new eGFR from a previous Creatinine or Cystatin C result, go to https://www.kidney.org/professionals/ kdoqi/gfr%5Fcalculator

## 2022-05-30 ENCOUNTER — Encounter: Payer: Self-pay | Admitting: Family Medicine

## 2022-05-30 ENCOUNTER — Ambulatory Visit (INDEPENDENT_AMBULATORY_CARE_PROVIDER_SITE_OTHER): Payer: 59 | Admitting: Family Medicine

## 2022-05-30 VITALS — BP 136/78 | HR 68 | Ht 69.0 in | Wt 318.0 lb

## 2022-05-30 DIAGNOSIS — I2510 Atherosclerotic heart disease of native coronary artery without angina pectoris: Secondary | ICD-10-CM

## 2022-05-30 DIAGNOSIS — Z202 Contact with and (suspected) exposure to infections with a predominantly sexual mode of transmission: Secondary | ICD-10-CM

## 2022-05-30 DIAGNOSIS — E118 Type 2 diabetes mellitus with unspecified complications: Secondary | ICD-10-CM

## 2022-05-30 DIAGNOSIS — Z125 Encounter for screening for malignant neoplasm of prostate: Secondary | ICD-10-CM | POA: Diagnosis not present

## 2022-05-30 DIAGNOSIS — Z0001 Encounter for general adult medical examination with abnormal findings: Secondary | ICD-10-CM | POA: Diagnosis not present

## 2022-05-30 DIAGNOSIS — Z1211 Encounter for screening for malignant neoplasm of colon: Secondary | ICD-10-CM

## 2022-05-30 DIAGNOSIS — Z0000 Encounter for general adult medical examination without abnormal findings: Secondary | ICD-10-CM

## 2022-05-30 NOTE — Progress Notes (Signed)
Subjective:    Patient ID: Manuel Carey, male    DOB: 06-16-1970, 52 y.o.   MRN: [id number]  HPI  Patient is a very pleasant 52 year-old Caucasian male here today for CPE.  Past medical history is significant for a non-ST elevation myocardial infarction in 2015.  Patient underwent cardiac catheterization at that time and had a Xience drug-eluting stent placed in the proximal LAD.  Colonoscopy was ordered last year but patient never scheduled appointment with GI.  He also did not return fasting for labs so I do not have a recent HgA1c to determine his sugar control.  Patient has not been taking his glipizide.  He has been taking Jardiance.  He is not checking his sugars.  He continues to smoke.  He denies any chest pain shortness of breath or dyspnea and.  He is due for flu shot, COVID shot, and a pneumonia vaccine.  He declines these today. Past Medical History:  Diagnosis Date   Diabetes mellitus type 2 with complications ([place])    Environmental allergies    Heart attack ([place])    2015, DES x 1, xience stent proximal LAD   HTN (hypertension)    Hyperlipemia    Past Surgical History:  Procedure Laterality Date   KNEE ARTHROSCOPY W/ MENISCECTOMY     left knee   LEFT HEART CATHETERIZATION WITH CORONARY ANGIOGRAM N/A 12/06/2013   Procedure: LEFT HEART CATHETERIZATION WITH CORONARY ANGIOGRAM;  Surgeon: Laverda Page, MD;  Location: [facility] CATH LAB;  Service: Cardiovascular;  Laterality: N/A;   PERCUTANEOUS STENT INTERVENTION  12/06/2013   Procedure: PERCUTANEOUS STENT INTERVENTION;  Surgeon: Laverda Page, MD;  Location: [facility] CATH LAB;  Service: Cardiovascular;;   Current Outpatient Medications on File Prior to Visit  Medication Sig Dispense Refill   aspirin 81 MG chewable tablet Chew 1 tablet (81 mg total) by mouth daily.     atorvastatin (LIPITOR) 80 MG tablet TAKE 1 TABLET BY MOUTH  DAILY (Patient taking differently: Take 80 mg by mouth at bedtime.) 90 tablet 3   empagliflozin  (JARDIANCE) 25 MG TABS tablet TAKE 1 TABLET BY MOUTH DAILY  BEFORE BREAKFAST 30 tablet 0   glipiZIDE (GLUCOTROL XL) 10 MG 24 hr tablet Take 1 tablet (10 mg total) by mouth daily with breakfast. 90 tablet 3   losartan (COZAAR) 25 MG tablet TAKE 1 TABLET BY MOUTH DAILY 90 tablet 3   metoprolol succinate (TOPROL-XL) 25 MG 24 hr tablet TAKE 1 TABLET (25 MG TOTAL) BY MOUTH DAILY. 30 tablet 2   naproxen (NAPROSYN) 375 MG tablet Take 1 tablet (375 mg total) by mouth 2 (two) times daily. 20 tablet 0   nitroGLYCERIN (NITROSTAT) 0.4 MG SL tablet Place 1 tablet (0.4 mg total) under the tongue every 5 (five) minutes as needed for chest pain. 25 tablet 3   No current facility-administered medications on file prior to visit.   No Known Allergies Social History   Socioeconomic History   Marital status: Single    Spouse name: Not on file   Number of children: 0   Years of education: Not on file   Highest education level: Not on file  Occupational History   Not on file  Tobacco Use   Smoking status: Every Day    Packs/day: 0.50    Years: 25.00    Total pack years: 12.50    Types: Cigarettes   Smokeless tobacco: Never  Vaping Use   Vaping Use: Never used  Substance and Sexual  Activity   Alcohol use: Not Currently   Drug use: Never   Sexual activity: Not on file  Other Topics Concern   Not on file  Social History Narrative   Not on file   Social Determinants of Health   Financial Resource Strain: Not on file  Food Insecurity: Not on file  Transportation Needs: Not on file  Physical Activity: Not on file  Stress: Not on file  Social Connections: Not on file  Intimate Partner Violence: Not on file   Family History  Problem Relation Age of Onset   Cancer Mother        breast   Diabetes Father    Hyperlipidemia Father    Hypertension Father      Review of Systems     Objective:   Physical Exam Vitals reviewed.  Constitutional:      General: He is not in acute distress.     Appearance: He is obese. He is not ill-appearing, toxic-appearing or diaphoretic.  HENT:     Head: Normocephalic and atraumatic.     Right Ear: Tympanic membrane and ear canal normal.     Left Ear: Tympanic membrane and ear canal normal.     Nose: Congestion and rhinorrhea present.     Mouth/Throat:     Mouth: Mucous membranes are moist.     Pharynx: No oropharyngeal exudate or posterior oropharyngeal erythema.  Eyes:     General: No scleral icterus.       Right eye: No discharge.        Left eye: No discharge.     Conjunctiva/sclera: Conjunctivae normal.     Pupils: Pupils are equal, round, and reactive to light.  Neck:     Vascular: No carotid bruit.  Cardiovascular:     Rate and Rhythm: Normal rate and regular rhythm.     Heart sounds: Normal heart sounds. No murmur heard.    No friction rub. No gallop.  Pulmonary:     Effort: Pulmonary effort is normal. No respiratory distress.     Breath sounds: No stridor. Wheezing present. No rhonchi or rales.  Chest:     Chest wall: No tenderness.  Abdominal:     General: Bowel sounds are normal. There is no distension.     Palpations: Abdomen is soft. There is no mass.     Tenderness: There is no abdominal tenderness. There is no guarding or rebound.     Hernia: No hernia is present.  Musculoskeletal:     Cervical back: Neck supple. No rigidity. No muscular tenderness.     Right lower leg: No edema.     Left lower leg: No edema.  Lymphadenopathy:     Cervical: No cervical adenopathy.  Skin:    General: Skin is warm.     Coloration: Skin is not jaundiced or pale.     Findings: No bruising, erythema, lesion or rash.  Neurological:     General: No focal deficit present.     Mental Status: He is oriented to person, place, and time.     Cranial Nerves: No cranial nerve deficit.     Sensory: No sensory deficit.     Motor: No weakness.     Coordination: Coordination normal.     Gait: Gait normal.     Deep Tendon Reflexes: Reflexes  normal.  Psychiatric:        Mood and Affect: Mood normal.        Behavior: Behavior normal.  Thought Content: Thought content normal.        Judgment: Judgment normal.           Assessment & Plan:  General medical exam  Coronary artery disease involving native coronary artery of native heart without angina pectoris  Diabetes mellitus type 2 with complications ([place]) - Plan: Hemoglobin A1c, CBC with Differential/Platelet, COMPLETE METABOLIC PANEL WITH GFR, Lipid panel, Protein / Creatinine Ratio, Urine  Prostate cancer screening - Plan: PSA  Colon cancer screening - Plan: Cologuard  Possible exposure to STD - Plan: HIV antibody (with reflex) Recommended colon cancer screening so I will schedule Cologuard given the fact that scheduling the colonoscopy was unsuccessful last time.  I will check a PSA to screen for prostate cancer.  Blood pressure is adequately controlled.  I will check a lipid panel.  I know the patient is nonfasting but he failed to return for lab work last time.  Goal LDL cholesterol is less than 55.  I will also check an A1c and if greater than 6.5 I would recommend Ozempic to try to achieve weight loss and control blood sugars

## 2022-05-31 LAB — HIV ANTIBODY (ROUTINE TESTING W REFLEX): HIV 1&2 Ab, 4th Generation: NONREACTIVE

## 2022-06-03 ENCOUNTER — Encounter: Payer: Self-pay | Admitting: Family Medicine

## 2022-06-03 ENCOUNTER — Ambulatory Visit: Payer: 59 | Admitting: Family Medicine

## 2022-06-03 VITALS — BP 128/72 | HR 59 | Temp 98.8°F | Ht 69.0 in | Wt 318.0 lb

## 2022-06-03 DIAGNOSIS — J329 Chronic sinusitis, unspecified: Secondary | ICD-10-CM

## 2022-06-03 LAB — CBC WITH DIFFERENTIAL/PLATELET
Absolute Monocytes: 536 cells/uL (ref 200–950)
Basophils Absolute: 72 cells/uL (ref 0–200)
Basophils Relative: 0.7 %
Eosinophils Absolute: 103 cells/uL (ref 15–500)
Eosinophils Relative: 1 %
HCT: 44.5 % (ref 38.5–50.0)
Hemoglobin: 15.5 g/dL (ref 13.2–17.1)
Lymphs Abs: 3255 cells/uL (ref 850–3900)
MCH: 31.9 pg (ref 27.0–33.0)
MCHC: 34.8 g/dL (ref 32.0–36.0)
MCV: 91.6 fL (ref 80.0–100.0)
MPV: 9 fL (ref 7.5–12.5)
Monocytes Relative: 5.2 %
Neutro Abs: 6335 cells/uL (ref 1500–7800)
Neutrophils Relative %: 61.5 %
Platelets: 213 10*3/uL (ref 140–400)
RBC: 4.86 10*6/uL (ref 4.20–5.80)
RDW: 12.7 % (ref 11.0–15.0)
Total Lymphocyte: 31.6 %
WBC: 10.3 10*3/uL (ref 3.8–10.8)

## 2022-06-03 LAB — COMPLETE METABOLIC PANEL WITH GFR
AG Ratio: 2 (calc) (ref 1.0–2.5)
ALT: 30 U/L (ref 9–46)
AST: 18 U/L (ref 10–35)
Albumin: 4.1 g/dL (ref 3.6–5.1)
Alkaline phosphatase (APISO): 96 U/L (ref 35–144)
BUN: 11 mg/dL (ref 7–25)
CO2: 28 mmol/L (ref 20–32)
Calcium: 9.3 mg/dL (ref 8.6–10.3)
Chloride: 104 mmol/L (ref 98–110)
Creat: 0.81 mg/dL (ref 0.70–1.30)
Globulin: 2.1 g/dL (calc) (ref 1.9–3.7)
Glucose, Bld: 138 mg/dL — ABNORMAL HIGH (ref 65–99)
Potassium: 3.8 mmol/L (ref 3.5–5.3)
Sodium: 142 mmol/L (ref 135–146)
Total Bilirubin: 0.5 mg/dL (ref 0.2–1.2)
Total Protein: 6.2 g/dL (ref 6.1–8.1)
eGFR: 106 mL/min/{1.73_m2} (ref >=60)

## 2022-06-03 LAB — LIPID PANEL
Cholesterol: 98 mg/dL (ref <200)
HDL: 30 mg/dL — ABNORMAL LOW (ref >=40)
LDL Cholesterol (Calc): 45 mg/dL (calc)
Non-HDL Cholesterol (Calc): 68 mg/dL (calc) (ref <130)
Total CHOL/HDL Ratio: 3.3 (calc) (ref <5.0)
Triglycerides: 156 mg/dL — ABNORMAL HIGH (ref <150)

## 2022-06-03 LAB — PROTEIN / CREATININE RATIO, URINE

## 2022-06-03 LAB — HEMOGLOBIN A1C
Hgb A1c MFr Bld: 7 % of total Hgb — ABNORMAL HIGH (ref <5.7)
Mean Plasma Glucose: 154 mg/dL
eAG (mmol/L): 8.5 mmol/L

## 2022-06-03 LAB — PSA: PSA: 0.5 ng/mL (ref <=4.00)

## 2022-06-03 MED ORDER — AMOXICILLIN 875 MG PO TABS: 875.0000 mg | ORAL_TABLET | Freq: Two times a day (BID) | ORAL | 0 refills | Status: AC

## 2022-06-03 NOTE — Progress Notes (Signed)
Subjective:    Patient ID: Manuel Carey, male    DOB: Feb 17, 1971, 52 y.o.   MRN: [id number]  Cough Associated symptoms include rhinorrhea.   Patient presents with 5 days of sinus pain and pressure.  He reports head congestion.  He denies any body aches.  He denies any shortness of breath or chest pain.  He denies any cough or pleurisy.  He does report sinus pressure and pain despite taking Robitussin and over-the-counter cold medication.  He believes patient can take a home COVID test.  He denies any nausea or diarrhea Past Medical History:  Diagnosis Date   Diabetes mellitus type 2 with complications ([place])    Environmental allergies    Heart attack ([place])    2015, DES x 1, xience stent proximal LAD   HTN (hypertension)    Hyperlipemia    Past Surgical History:  Procedure Laterality Date   KNEE ARTHROSCOPY W/ MENISCECTOMY     left knee   LEFT HEART CATHETERIZATION WITH CORONARY ANGIOGRAM N/A 12/06/2013   Procedure: LEFT HEART CATHETERIZATION WITH CORONARY ANGIOGRAM;  Surgeon: Laverda Page, MD;  Location: [facility] CATH LAB;  Service: Cardiovascular;  Laterality: N/A;   PERCUTANEOUS STENT INTERVENTION  12/06/2013   Procedure: PERCUTANEOUS STENT INTERVENTION;  Surgeon: Laverda Page, MD;  Location[facility] CATH LAB;  Service: Cardiovascular;;   Current Outpatient Medications on File Prior to Visit  Medication Sig Dispense Refill   aspirin 81 MG chewable tablet Chew 1 tablet (81 mg total) by mouth daily.     atorvastatin (LIPITOR) 80 MG tablet TAKE 1 TABLET BY MOUTH  DAILY (Patient taking differently: Take 80 mg by mouth at bedtime.) 90 tablet 3   empagliflozin (JARDIANCE) 25 MG TABS tablet TAKE 1 TABLET BY MOUTH DAILY  BEFORE BREAKFAST 90 tablet 0   losartan (COZAAR) 25 MG tablet TAKE 1 TABLET BY MOUTH DAILY 90 tablet 3   metoprolol succinate (TOPROL-XL) 25 MG 24 hr tablet TAKE 1 TABLET (25 MG TOTAL) BY MOUTH DAILY. 30 tablet 2   naproxen (NAPROSYN) 375 MG tablet Take 1 tablet (375 mg  total) by mouth 2 (two) times daily. (Patient not taking: Reported on 05/30/2022) 20 tablet 0   nitroGLYCERIN (NITROSTAT) 0.4 MG SL tablet Place 1 tablet (0.4 mg total) under the tongue every 5 (five) minutes as needed for chest pain. 25 tablet 3   No current facility-administered medications on file prior to visit.   No Known Allergies Social History   Socioeconomic History   Marital status: Single    Spouse name: Not on file   Number of children: 0   Years of education: Not on file   Highest education level: Not on file  Occupational History   Not on file  Tobacco Use   Smoking status: Every Day    Packs/day: 0.50    Years: 25.00    Total pack years: 12.50    Types: Cigarettes   Smokeless tobacco: Never  Vaping Use   Vaping Use: Never used  Substance and Sexual Activity   Alcohol use: Not Currently   Drug use: Never   Sexual activity: Not on file  Other Topics Concern   Not on file  Social History Narrative   Not on file   Social Determinants of Health   Financial Resource Strain: Not on file  Food Insecurity: Not on file  Transportation Needs: Not on file  Physical Activity: Not on file  Stress: Not on file  Social Connections: Not on file  Intimate Partner Violence: Not on file      Review of Systems  HENT:  Positive for congestion, rhinorrhea, sinus pressure and sinus pain.   All other systems reviewed and are negative.      Objective:   Physical Exam Vitals reviewed.  Constitutional:      Appearance: He is obese. He is not ill-appearing or toxic-appearing.  HENT:     Right Ear: Tympanic membrane and ear canal normal.     Left Ear: Tympanic membrane and ear canal normal.     Nose: Congestion and rhinorrhea present.     Mouth/Throat:     Pharynx: No oropharyngeal exudate or posterior oropharyngeal erythema.  Eyes:     Conjunctiva/sclera: Conjunctivae normal.     Pupils: Pupils are equal, round, and reactive to light.  Cardiovascular:     Rate and  Rhythm: Normal rate and regular rhythm.     Heart sounds: Normal heart sounds.  Pulmonary:     Effort: Pulmonary effort is normal. No respiratory distress.     Breath sounds: Normal breath sounds. No wheezing, rhonchi or rales.  Abdominal:     General: Abdomen is flat. Bowel sounds are normal.     Palpations: Abdomen is soft.     Tenderness: There is no abdominal tenderness. There is no guarding or rebound.  Neurological:     Mental Status: He is alert.           Assessment & Plan:  Rhinosinusitis Begin amoxicillin 875 mg twice daily for 10 days and use Coricidin HBP for head congestion and rhinorrhea

## 2022-06-27 ENCOUNTER — Other Ambulatory Visit: Payer: Self-pay | Admitting: Cardiology

## 2022-06-27 DIAGNOSIS — I2510 Atherosclerotic heart disease of native coronary artery without angina pectoris: Secondary | ICD-10-CM

## 2022-06-27 LAB — COLOGUARD: COLOGUARD: NEGATIVE

## 2022-07-15 ENCOUNTER — Encounter: Payer: Self-pay | Admitting: Cardiology

## 2022-07-15 ENCOUNTER — Ambulatory Visit: Payer: 59 | Admitting: Cardiology

## 2022-07-15 VITALS — BP 143/81 | HR 57 | Ht 69.0 in | Wt 316.0 lb

## 2022-07-15 DIAGNOSIS — I2510 Atherosclerotic heart disease of native coronary artery without angina pectoris: Secondary | ICD-10-CM

## 2022-07-15 DIAGNOSIS — Z955 Presence of coronary angioplasty implant and graft: Secondary | ICD-10-CM

## 2022-07-15 DIAGNOSIS — I10 Essential (primary) hypertension: Secondary | ICD-10-CM

## 2022-07-15 DIAGNOSIS — E782 Mixed hyperlipidemia: Secondary | ICD-10-CM

## 2022-07-15 DIAGNOSIS — E119 Type 2 diabetes mellitus without complications: Secondary | ICD-10-CM

## 2022-07-15 DIAGNOSIS — Z720 Tobacco use: Secondary | ICD-10-CM

## 2022-07-15 MED ORDER — METOPROLOL SUCCINATE ER 25 MG PO TB24: 25.0000 mg | ORAL_TABLET | Freq: Every day | ORAL | 1 refills | Status: DC

## 2022-07-15 MED ORDER — NITROGLYCERIN 0.4 MG SL SUBL: 0.4000 mg | SUBLINGUAL_TABLET | SUBLINGUAL | 0 refills | Status: DC | PRN

## 2022-07-15 MED ORDER — FENOFIBRATE 145 MG PO TABS: 145.0000 mg | ORAL_TABLET | Freq: Every day | ORAL | 0 refills | Status: DC

## 2022-07-15 NOTE — Progress Notes (Signed)
Manuel Carey Date of Birth: 1971/02/04 MRN: [id number] Primary Care Provider:Pickard, Cammie Mcgee, MD Former Cardiology Providers: Jeri Lager, APRN, FNP-C, Dr. Adrian Prows Primary Cardiologist: Rex Kras, DO, [facility] (established care 01/06/2020)  Date: 07/15/22 Last Office Visit: 02/26/2021  Chief Complaint  Patient presents with   Atherosclerosis of native coronary artery of native heart w   Follow-up    Did not have stress test    HPI  Manuel Carey is a 52 y.o.  male whose past medical history and cardiovascular risk factors include: Hx of COVID (01/2021), hypertension, hyperlipidemia, ongoing tobacco use, diabetes,  CAD and NSTEMI on 12/03/2013 S/P  3 x 33 mm Xience DES proximal LAD on  12/06/2013.   Patient presents today for greater than a year since his last office visit for follow-up and medication refill given his history of CAD/prior PCI/history of NSTEMI.  Since last office visit patient is doing well from a cardiovascular standpoint.  He denies anginal discomfort or heart failure symptoms.  His effort related dyspnea remains chronic and stable.  At the last office visit the shared decision was to proceed with an MPI to reevaluate for reversible ischemia; however, patient would like to hold off on this at the current time.  Since he is asymptomatic it is quite reasonable.  No use of sublingual nitroglycerin tablets since last office visit.  He continues to smoke <0.5 packs/day.  And he has lost 9 pounds since October 2022 for which he is congratulated for at today's visit.  ALLERGIES: No Known Allergies   MEDICATION LIST PRIOR TO VISIT: Current Outpatient Medications on File Prior to Visit  Medication Sig Dispense Refill   aspirin 81 MG chewable tablet Chew 1 tablet (81 mg total) by mouth daily.     atorvastatin (LIPITOR) 80 MG tablet TAKE 1 TABLET BY MOUTH  DAILY (Patient taking differently: Take 80 mg by mouth at bedtime.) 90 tablet 3   empagliflozin  (JARDIANCE) 25 MG TABS tablet TAKE 1 TABLET BY MOUTH DAILY  BEFORE BREAKFAST 90 tablet 0   losartan (COZAAR) 25 MG tablet TAKE 1 TABLET BY MOUTH DAILY 90 tablet 3   No current facility-administered medications on file prior to visit.    PAST MEDICAL HISTORY: Past Medical History:  Diagnosis Date   Diabetes mellitus type 2 with complications ([place])    Environmental allergies    Heart attack ([place])    2015, DES x 1, xience stent proximal LAD   HTN (hypertension)    Hyperlipemia     PAST SURGICAL HISTORY: Past Surgical History:  Procedure Laterality Date   KNEE ARTHROSCOPY W/ MENISCECTOMY     left knee   LEFT HEART CATHETERIZATION WITH CORONARY ANGIOGRAM N/A 12/06/2013   Procedure: LEFT HEART CATHETERIZATION WITH CORONARY ANGIOGRAM;  Surgeon: Laverda Page, MD;  Location: [facility] CATH LAB;  Service: Cardiovascular;  Laterality: N/A;   PERCUTANEOUS STENT INTERVENTION  12/06/2013   Procedure: PERCUTANEOUS STENT INTERVENTION;  Surgeon: Laverda Page, MD;  Location: [facility] CATH LAB;  Service: Cardiovascular;;    FAMILY HISTORY: The patient's family history includes Cancer in his mother; Diabetes in his father; Hyperlipidemia in his father; Hypertension in his father.   SOCIAL HISTORY:  The patient  reports that he has been smoking cigarettes. He has a 12.50 pack-year smoking history. He has never used smokeless tobacco. He reports that he does not currently use alcohol. He reports that he does not use drugs.  Review of Systems  Constitutional: Negative for chills and  fever.  HENT:  Negative for hoarse voice and nosebleeds.   Eyes:  Negative for discharge, double vision and pain.  Cardiovascular:  Positive for dyspnea on exertion (chronic and stable.). Negative for chest pain, claudication, cyanosis, leg swelling, near-syncope, orthopnea, palpitations, paroxysmal nocturnal dyspnea and syncope.  Respiratory:  Positive for snoring. Negative for hemoptysis and shortness of breath.    Musculoskeletal:  Negative for muscle cramps and myalgias.  Gastrointestinal:  Negative for abdominal pain, constipation, diarrhea, hematemesis, hematochezia, melena, nausea and vomiting.  Neurological:  Negative for dizziness and light-headedness.    PHYSICAL EXAM:    07/15/2022    2:16 PM 06/03/2022   11:45 AM 05/30/2022    2:42 PM  Vitals with BMI  Height '5\' 9"'$  '5\' 9"'$  '5\' 9"'$   Weight 316 lbs 318 lbs 318 lbs  BMI 46.64 [id number] [id number]  Systolic [id number]  Diastolic 81 72 78  Pulse 57 59 68    Physical Exam  Constitutional: No distress.  Age appropriate, hemodynamically stable.   Neck:  Still short stature, unable to evaluate JVP due to adipose tissue  Cardiovascular: Normal rate, regular rhythm, S1 normal, S2 normal, intact distal pulses and normal pulses. Exam reveals no gallop, no S3 and no S4.  No murmur heard. Pulmonary/Chest: Effort normal and breath sounds normal. No stridor. He has no wheezes. He has no rales.  Abdominal: Soft. Bowel sounds are normal. He exhibits no distension. There is no abdominal tenderness.  Musculoskeletal:        General: No edema.     Cervical back: Neck supple.  Neurological: He is alert and oriented to person, place, and time. He has intact cranial nerves (2-12).  Skin: Skin is warm and moist.   RADIOLOGY: CT renal stone study 12/30/2019: Aortic atherosclerosis.  CARDIAC DATABASE: EKG: 02/26/2021: Normal sinus rhythm, 62 bpm, without underlying ischemia or injury pattern  Echocardiogram: 12/16/2018 :  Normal LV systolic function with visual EF 55-60%. Left ventricle cavity is normal in size. Mild concentric hypertrophy of the left ventricle. Normal global wall motion. Normal diastolic filling pattern. No significant valvular abnormality.  Stress Testing:  Treadmill stress test  [12/24/2013]: Indications: Assessment of Chest Pain. Conclusions: Negative for ischemia. Continue primary prevention The patient exercised according to the [facility]  protocol, Total time recorded 9 Min. 0 sec. achieving a max heart rate of 164 which was 93% of MPHR for age and 10.0 METS of work. Baseline NIBP was 122/74. Peak NIBP was 122/74 MaxSysp was: 122 MaxDiasp was: 74.The baseline ECG showed NSR,LAD, LAFB. During exercise there was no ST-T changes of ischemia. Symptoms: THR met. Mild dyspnea. Arrhythmia: Rare PAC  Heart Catheterization: Coronary Angiogram 12/03/2013: S/P 3 x 33 mm Xience DES proximal LAD on 12/06/2013. Mild disease in other vessels and mild ectasion in distal RCA.  LABORATORY DATA:    Latest Ref Rng & Units 05/30/2022    3:01 PM 10/15/2021    4:54 AM 01/01/2021    9:42 AM  CBC  WBC 3.8 - 10.8 Thousand/uL 10.3  11.2  7.2   Hemoglobin 13.2 - 17.1 g/dL 15.5  15.4  13.8   Hematocrit 38.5 - 50.0 % 44.5  47.3  41.3   Platelets 140 - 400 Thousand/uL 213  203  183        Latest Ref Rng & Units 05/30/2022    3:01 PM 10/15/2021    4:54 AM 01/01/2021    9:42 AM  CMP  Glucose 65 - 99 mg/dL 138  173  199   BUN 7 - 25 mg/dL '11  15  7   '$ Creatinine 0.70 - 1.30 mg/dL 0.81  1.00  0.74   Sodium 135 - 146 mmol/L 142  137  141   Potassium 3.5 - 5.3 mmol/L 3.8  4.0  3.9   Chloride 98 - 110 mmol/L 104  106  106   CO2 20 - 32 mmol/L '28  25  27   '$ Calcium 8.6 - 10.3 mg/dL 9.3  8.8  8.4   Total Protein 6.1 - 8.1 g/dL 6.2  6.2  6.0   Total Bilirubin 0.2 - 1.2 mg/dL 0.5  0.4  0.4   Alkaline Phos 38 - 126 U/L  110    AST 10 - 35 U/L '18  18  25   '$ ALT 9 - 46 U/L 30  25  38     Lipid Panel     Component Value Date/Time   CHOL 98 05/30/2022 1501   TRIG 156 (H) 05/30/2022 1501   HDL 30 (L) 05/30/2022 1501   CHOLHDL 3.3 05/30/2022 1501   VLDL 36 12/04/2013 0310   LDLCALC 45 05/30/2022 1501    Lab Results  Component Value Date   HGBA1C 7.0 (H) 05/30/2022   HGBA1C 9.8 (H) 01/01/2021   HGBA1C 6.6 (H) 04/06/2019   No components found for: "NTPROBNP" No results found for: "TSH"  Cardiac Panel (last 3 results) No results for input(s):  "CKTOTAL", "CKMB", "TROPONINIHS", "RELINDX" in the last 72 hours.  IMPRESSION:    ICD-10-CM   1. Atherosclerosis of native coronary artery of native heart without angina pectoris  I25.10 EKG 12-Lead    PCV ECHOCARDIOGRAM COMPLETE    metoprolol succinate (TOPROL-XL) 25 MG 24 hr tablet    nitroGLYCERIN (NITROSTAT) 0.4 MG SL tablet    Ambulatory referral to Sleep Studies    2. History of coronary angioplasty with insertion of stent  Z95.5     3. Essential hypertension  I10     4. Non-insulin dependent type 2 diabetes mellitus ([place])  E11.9     5. Mixed hyperlipidemia  E78.2     6. Tobacco use  Z72.0        RECOMMENDATIONS: SHAUL LARMON is a 52 y.o. male whose past medical history and cardiovascular risk factors include: hypertension, hyperlipidemia, ongoing tobacco use, diabetes,  CAD and NSTEMI on 12/03/2013 S/P  3 x 33 mm Xience DES proximal LAD on  12/06/2013.   History of NSTEMI, CAD, PCI to the proximal LAD back in July 2025.  He now presents for follow-up greater than 1 year due to medication refills.  At the last office visit in October 2022 shared decision was to proceed with MPI as it has been greater than 5 years since his PCI.  However, patient to hold off as he is asymptomatic and continue the same.  Monitor for now.  Since last office visit patient has lost approximately 9 pounds due to lifestyle changes, no structured exercise program or daily routine, has multiple cardiovascular risk factors as outlined above.  EKG today illustrates sinus rhythm without ectopy.  Echo will be ordered to evaluate for structural heart disease and left ventricular systolic function.  Refill metoprolol succinate and sublingual nitroglycerin tablets.  Will refer him to sleep medicine to evaluate for sleep apnea or sleep-related disorders given his body habitus, risk factors, snoring.  Given his CAD and underlying obesity as well as diabetes recommended Ozempic to help facilitate weight  loss.  Patient is asked  that when he starts Ozempic he will need to follow-up every 5 weeks to uptitrate the dose.  Patient states that the office visits are cost prohibitive and will follow-up with PCP.  Tobacco cessation counseling: Currently smoking <0.5 packs/day   Patient is willing to quit at this time. 5 mins were spent counseling patient cessation techniques. We discussed various methods to help quit smoking, including deciding on a date to quit, joining a support group, pharmacological agents- nicotine gum/patch/lozenges.  Patient states that Chantix worked for a few years and he reverted back to smoking.  He has not done well with nicotine patches/lozenges.  He will discuss management with PCP. I will reassess his progress at the next follow-up visit   FINAL MEDICATION LIST END OF ENCOUNTER: Meds ordered this encounter  Medications   fenofibrate (TRICOR) 145 MG tablet    Sig: Take 1 tablet (145 mg total) by mouth daily.    Dispense:  90 tablet    Refill:  0   metoprolol succinate (TOPROL-XL) 25 MG 24 hr tablet    Sig: Take 1 tablet (25 mg total) by mouth daily.    Dispense:  90 tablet    Refill:  1    Needs to schedule an appointment for future refills   nitroGLYCERIN (NITROSTAT) 0.4 MG SL tablet    Sig: Place 1 tablet (0.4 mg total) under the tongue every 5 (five) minutes as needed for up to 30 doses for chest pain.    Dispense:  30 tablet    Refill:  0      Current Outpatient Medications:    aspirin 81 MG chewable tablet, Chew 1 tablet (81 mg total) by mouth daily., Disp: , Rfl:    atorvastatin (LIPITOR) 80 MG tablet, TAKE 1 TABLET BY MOUTH  DAILY (Patient taking differently: Take 80 mg by mouth at bedtime.), Disp: 90 tablet, Rfl: 3   empagliflozin (JARDIANCE) 25 MG TABS tablet, TAKE 1 TABLET BY MOUTH DAILY  BEFORE BREAKFAST, Disp: 90 tablet, Rfl: 0   fenofibrate (TRICOR) 145 MG tablet, Take 1 tablet (145 mg total) by mouth daily., Disp: 90 tablet, Rfl: 0   losartan  (COZAAR) 25 MG tablet, TAKE 1 TABLET BY MOUTH DAILY, Disp: 90 tablet, Rfl: 3   metoprolol succinate (TOPROL-XL) 25 MG 24 hr tablet, Take 1 tablet (25 mg total) by mouth daily., Disp: 90 tablet, Rfl: 1   nitroGLYCERIN (NITROSTAT) 0.4 MG SL tablet, Place 1 tablet (0.4 mg total) under the tongue every 5 (five) minutes as needed for up to 30 doses for chest pain., Disp: 30 tablet, Rfl: 0  Orders Placed This Encounter  Procedures   Ambulatory referral to Sleep Studies   EKG 12-Lead   PCV ECHOCARDIOGRAM COMPLETE   --Continue cardiac medications as reconciled in final medication list. --Return in about 6 months (around 01/13/2023) for Follow up, CAD. Or sooner if needed. --Continue follow-up with your primary care physician regarding the management of your other chronic comorbid conditions.  Patient's questions and concerns were addressed to his satisfaction. He voices understanding of the instructions provided during this encounter.   This note was created using a voice recognition software as a result there may be grammatical errors inadvertently enclosed that do not reflect the nature of this encounter. Every attempt is made to correct such errors.  Mechele Claude [facility]  Pager: [phone] Office: [phone] Evaluation for sleep apnea

## 2022-07-18 ENCOUNTER — Other Ambulatory Visit: Payer: Self-pay

## 2022-07-18 DIAGNOSIS — E118 Type 2 diabetes mellitus with unspecified complications: Secondary | ICD-10-CM

## 2022-08-05 LAB — HM DIABETES EYE EXAM

## 2022-08-08 ENCOUNTER — Other Ambulatory Visit: Payer: Self-pay | Admitting: Family Medicine

## 2022-08-12 ENCOUNTER — Encounter: Payer: Self-pay | Admitting: Family Medicine

## 2022-09-02 ENCOUNTER — Ambulatory Visit: Payer: 59

## 2022-09-02 DIAGNOSIS — I2510 Atherosclerotic heart disease of native coronary artery without angina pectoris: Secondary | ICD-10-CM

## 2022-10-26 ENCOUNTER — Other Ambulatory Visit: Payer: Self-pay | Admitting: Cardiology

## 2022-10-27 ENCOUNTER — Other Ambulatory Visit: Payer: Self-pay | Admitting: Cardiology

## 2022-10-29 ENCOUNTER — Ambulatory Visit
Admission: RE | Admit: 2022-10-29 | Discharge: 2022-10-29 | Disposition: A | Discharge status: Home / Self Care | Payer: No Typology Code available for payment source | Source: Ambulatory Visit | Attending: Nurse Practitioner | Admitting: Nurse Practitioner

## 2022-10-29 ENCOUNTER — Other Ambulatory Visit: Payer: Self-pay | Admitting: General Practice

## 2022-10-29 DIAGNOSIS — M79671 Pain in right foot: Secondary | ICD-10-CM

## 2022-12-02 DIAGNOSIS — S93401A Sprain of unspecified ligament of right ankle, initial encounter: Secondary | ICD-10-CM | POA: Insufficient documentation

## 2022-12-06 IMAGING — CT CT ABD-PELV W/ CM
2 of 5 series · 15 of 46 positions shown, 17 images · IV contrast (Omni 300)
Comparison: CT the abdomen and pelvis 12/30/2019.

CLINICAL DATA: 50-year-old male with history of acute onset of
abdominal pain, most severe in the right lower quadrant.

EXAM:
CT ABDOMEN AND PELVIS WITH CONTRAST
TECHNIQUE: Multidetector CT imaging of the abdomen and pelvis was performed
using the standard protocol following bolus administration of
intravenous contrast.

[Series 3: a/p w/ 5mm · axial · 0.98mm/px · z∈[+779,+1214]mm · 12 of 99 slices shown, 14 images]
[im 6/99  soft-tissue]
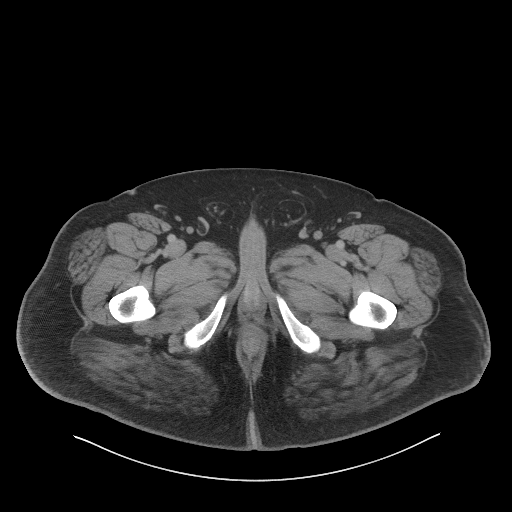
[im 6/99  bone]
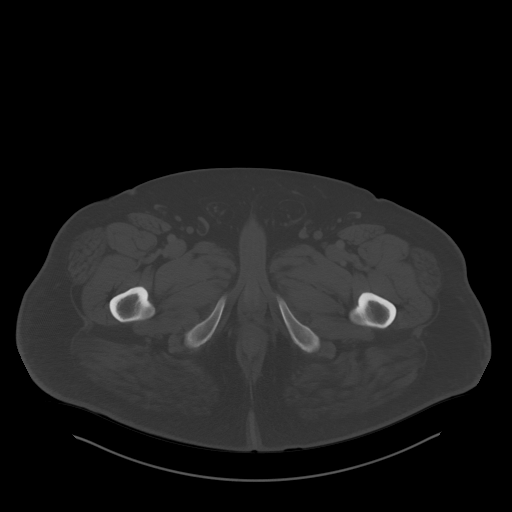
[im 16/99  soft-tissue]
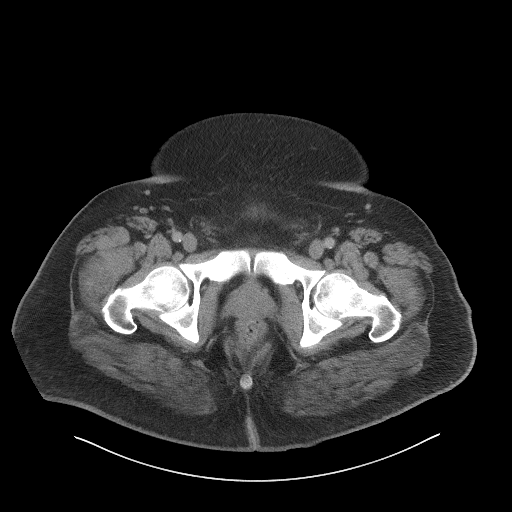
[im 21/99  soft-tissue]
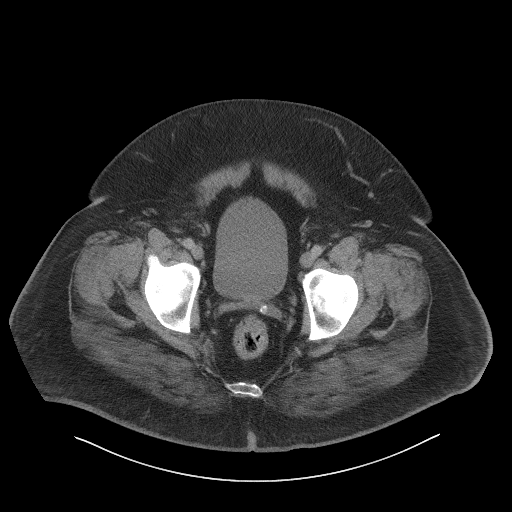
[im 31/99  soft-tissue]
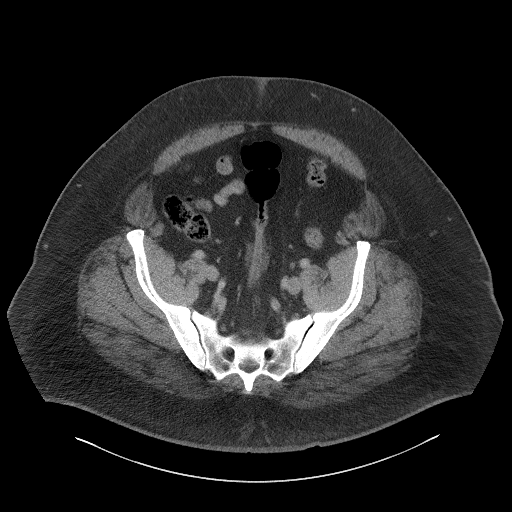
[im 37/99  soft-tissue]
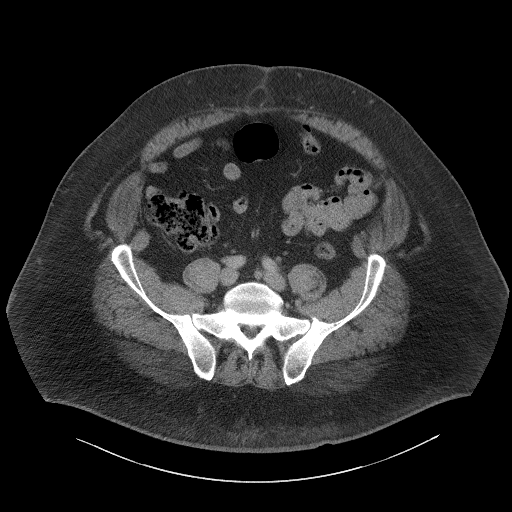
[im 47/99  soft-tissue]
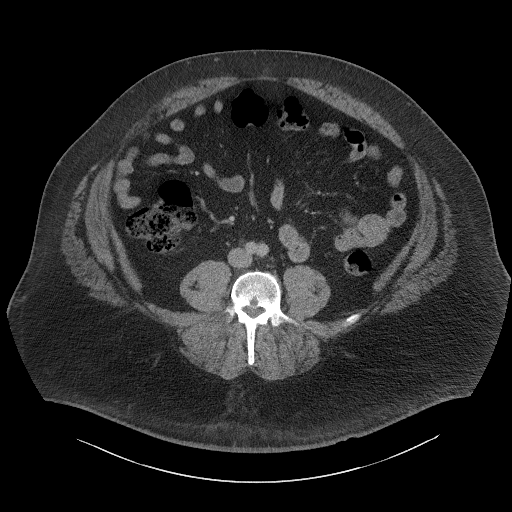
[im 52/99  soft-tissue]
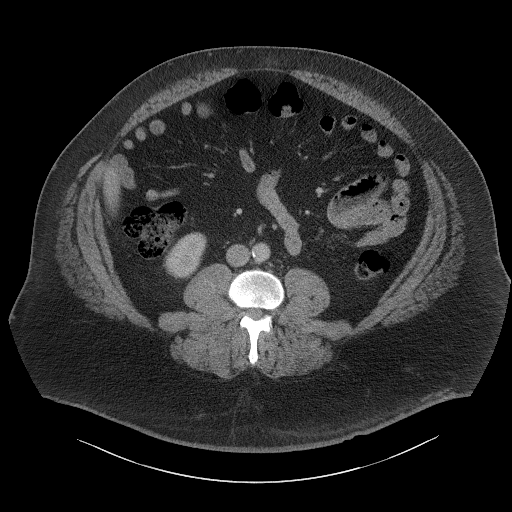
[im 62/99  soft-tissue]
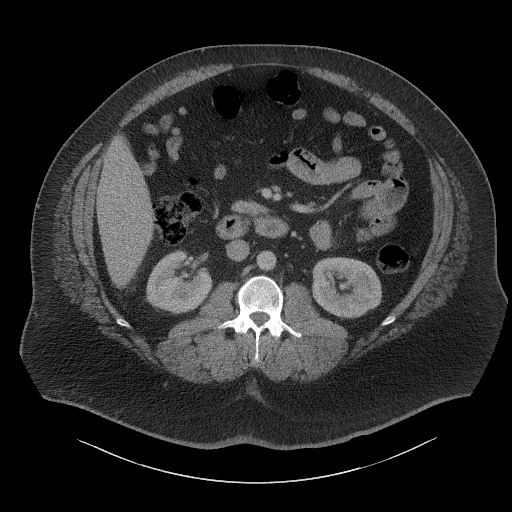
[im 68/99  soft-tissue]
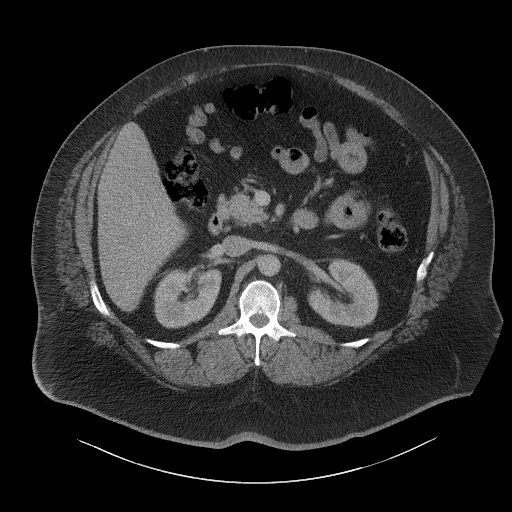
[im 68/99  bone]
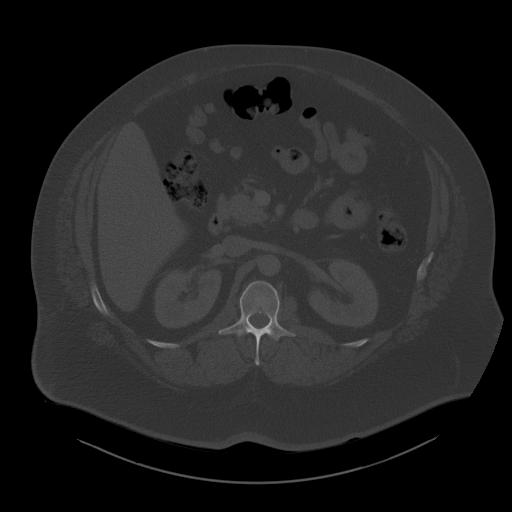
[im 78/99  soft-tissue]
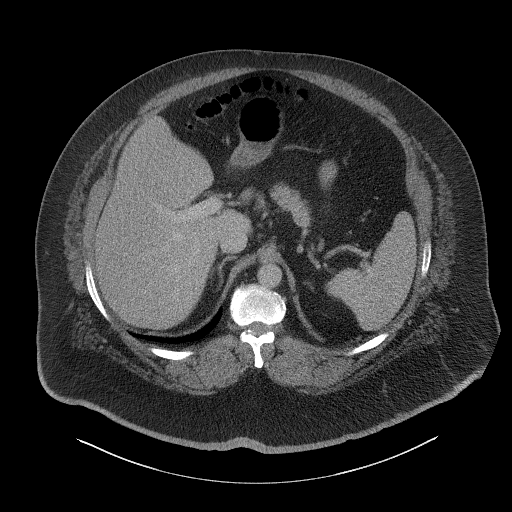
[im 83/99  soft-tissue]
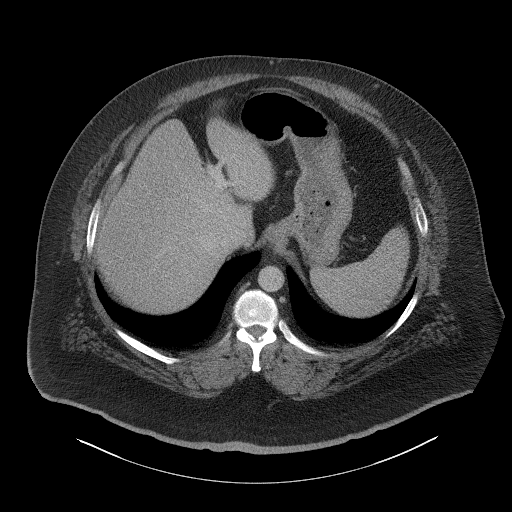
[im 93/99  soft-tissue]
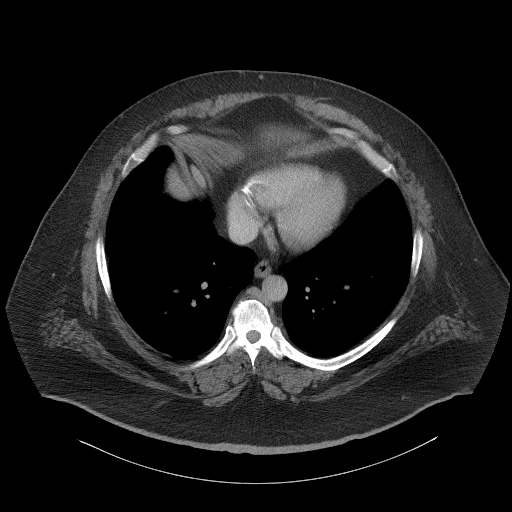

[Series 6: a/p w/ cor · coronal · 0.96mm/px · 3 of 205 slices shown]
[im 69/205  soft-tissue]
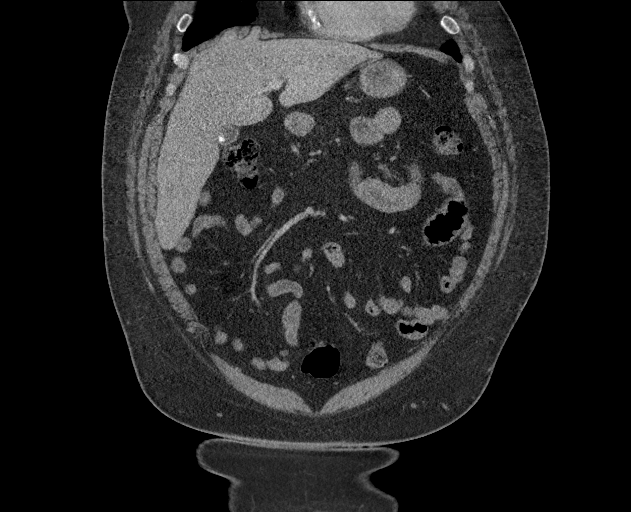
[im 91/205  soft-tissue]
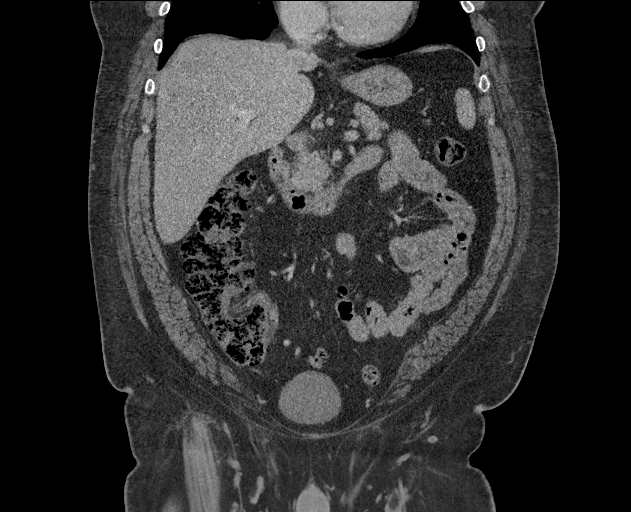
[im 114/205  soft-tissue]
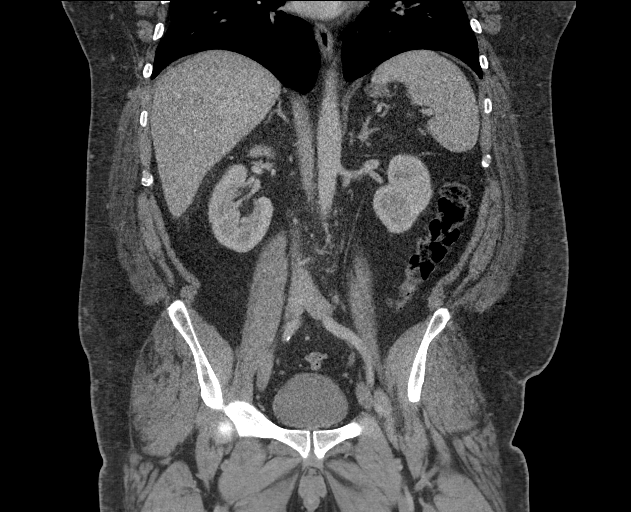

[15 of 46 positions shown; findings below may reference images not displayed]

RADIATION DOSE REDUCTION: This exam was performed according to the
departmental dose-optimization program which includes automated
exposure control, adjustment of the mA and/or kV according to
patient size and/or use of iterative reconstruction technique.

CONTRAST:  100mL OMNIPAQUE IOHEXOL 300 MG/ML  SOLN
FINDINGS: Lower chest: Atherosclerotic calcifications in the right coronary
artery.

Hepatobiliary: No suspicious cystic or solid hepatic lesions. No
intra or extrahepatic biliary ductal dilatation. Numerous tiny
calcified gallstones lying dependently in the gallbladder. No
findings to suggest an acute cholecystitis are noted at this time.

Pancreas: No pancreatic mass. No pancreatic ductal dilatation. No
pancreatic or peripancreatic fluid collections or inflammatory
changes.

Spleen: Unremarkable.

Adrenals/Urinary Tract: Bilateral kidneys and adrenal glands are
normal in appearance. No hydroureteronephrosis. Urinary bladder is
normal in appearance.

Stomach/Bowel: The appearance of the stomach is normal. No
pathologic dilatation of small bowel or colon. Normal appendix.

Vascular/Lymphatic: Aortic atherosclerosis, without evidence of
aneurysm or dissection in the abdominal or pelvic vasculature. No
lymphadenopathy noted in the abdomen or pelvis.

Reproductive: Prostate gland and seminal vesicles are unremarkable
in appearance.

Other: Tiny umbilical hernia containing only omental fat. No
significant volume of ascites. No pneumoperitoneum.

Musculoskeletal: There are no aggressive appearing lytic or blastic
lesions noted in the visualized portions of the skeleton.
IMPRESSION: 1. No acute findings are noted in the abdomen or pelvis to account
for the patient's symptoms.
2. Phthisis without evidence of acute cholecystitis at this time.
3. Aortic atherosclerosis, in addition to at least right coronary
artery disease. Please note that although the presence of coronary
artery calcium documents the presence of coronary artery disease,
the severity of this disease and any potential stenosis cannot be
assessed on this non-gated CT examination. Assessment for potential
risk factor modification, dietary therapy or pharmacologic therapy
may be warranted, if clinically indicated.

## 2022-12-09 ENCOUNTER — Other Ambulatory Visit: Payer: Self-pay | Admitting: Orthopedic Surgery

## 2022-12-09 DIAGNOSIS — S93401A Sprain of unspecified ligament of right ankle, initial encounter: Secondary | ICD-10-CM

## 2022-12-17 ENCOUNTER — Ambulatory Visit
Admission: RE | Admit: 2022-12-17 | Discharge: 2022-12-17 | Disposition: A | Discharge status: Home / Self Care | Payer: Worker's Compensation | Source: Ambulatory Visit | Attending: Orthopedic Surgery | Admitting: Orthopedic Surgery

## 2022-12-17 DIAGNOSIS — S93401A Sprain of unspecified ligament of right ankle, initial encounter: Secondary | ICD-10-CM

## 2023-01-13 ENCOUNTER — Ambulatory Visit: Payer: 59 | Admitting: Cardiology

## 2023-01-13 ENCOUNTER — Encounter: Payer: Self-pay | Admitting: Cardiology

## 2023-01-13 VITALS — BP 121/83 | HR 56 | Resp 16 | Ht 69.0 in | Wt 308.0 lb

## 2023-01-13 DIAGNOSIS — I214 Non-ST elevation (NSTEMI) myocardial infarction: Secondary | ICD-10-CM

## 2023-01-13 DIAGNOSIS — I2510 Atherosclerotic heart disease of native coronary artery without angina pectoris: Secondary | ICD-10-CM

## 2023-01-13 DIAGNOSIS — E118 Type 2 diabetes mellitus with unspecified complications: Secondary | ICD-10-CM

## 2023-01-13 DIAGNOSIS — Z720 Tobacco use: Secondary | ICD-10-CM

## 2023-01-13 DIAGNOSIS — I10 Essential (primary) hypertension: Secondary | ICD-10-CM

## 2023-01-13 DIAGNOSIS — E782 Mixed hyperlipidemia: Secondary | ICD-10-CM

## 2023-01-13 DIAGNOSIS — Z955 Presence of coronary angioplasty implant and graft: Secondary | ICD-10-CM

## 2023-01-13 MED ORDER — OZEMPIC (0.25 OR 0.5 MG/DOSE) 2 MG/1.5ML ~~LOC~~ SOPN
0.2500 mg | PEN_INJECTOR | SUBCUTANEOUS | 1 refills | Status: AC
Start: 2023-01-13 — End: 2023-03-04

## 2023-01-13 NOTE — Progress Notes (Signed)
Manuel Carey Date of Birth: 10/04/1970 MRN: [id number] Primary Care Provider:Pickard, Priscille Heidelberg, MD Former Cardiology Providers: Alt[place], APRN, FNP-C, Dr. Yates Decamp Primary Cardiologist: Tessa Lerner, DO, [facility] (established care 01/06/2020)  Date: 01/13/23 Last Office Visit: 07/15/2022  Chief Complaint  Patient presents with   Atherosclerosis of native coronary artery of native heart w   Follow-up    HPI  Manuel Carey is a 52 y.o.  male whose past medical history and cardiovascular risk factors include: Hx of COVID (01/2021), hypertension, hyperlipidemia, ongoing tobacco use, diabetes,  CAD and NSTEMI on 12/03/2013 S/P  3 x 33 mm Xience DES proximal LAD on  12/06/2013.   Patient being followed by the practice given his CAD/prior PCI/history of NSTEMI.  He presents today for 42-month follow-up visit.  At last office visit shared decision was to hold off on stress test despite his last ischemic evaluation being in 2015.  He was educated on the importance of complete smoking cessation.  We also discussed initiating Ozempic to help facilitate weight loss given his comorbidities but he chose to hold off.  Patient presents today for 12-month follow-up visit.  Over the last 6 months he denies anginal discomfort or heart failure symptoms.  He again has lost weight with lifestyle changes with an additional 8 pounds since last visit.  He is congratulated on his efforts.  He is also stopped smoking for the last 1.5 weeks for which he is congratulated for.  At the last office visit he was requested to have a consultation with sleep medicine but this is still pending.  An echocardiogram since last office visit notes preserved LVEF.  Patient is willing to start Ozempic.  ALLERGIES: No Known Allergies   MEDICATION LIST PRIOR TO VISIT: Current Outpatient Medications on File Prior to Visit  Medication Sig Dispense Refill   aspirin 81 MG chewable tablet Chew 1 tablet (81 mg total) by mouth  daily.     atorvastatin (LIPITOR) 80 MG tablet TAKE 1 TABLET BY MOUTH  DAILY (Patient taking differently: Take 80 mg by mouth at bedtime.) 90 tablet 3   fenofibrate (TRICOR) 145 MG tablet TAKE 1 TABLET BY MOUTH EVERY DAY 90 tablet 2   JARDIANCE 25 MG TABS tablet TAKE 1 TABLET BY MOUTH DAILY  BEFORE BREAKFAST 90 tablet 3   losartan (COZAAR) 25 MG tablet TAKE 1 TABLET BY MOUTH DAILY 90 tablet 3   meloxicam (MOBIC) 15 MG tablet Take 15 mg by mouth daily.     metoprolol succinate (TOPROL-XL) 25 MG 24 hr tablet Take 1 tablet (25 mg total) by mouth daily. 90 tablet 1   nitroGLYCERIN (NITROSTAT) 0.4 MG SL tablet Place 1 tablet (0.4 mg total) under the tongue every 5 (five) minutes as needed for up to 30 doses for chest pain. 30 tablet 0   No current facility-administered medications on file prior to visit.    PAST MEDICAL HISTORY: Past Medical History:  Diagnosis Date   Diabetes mellitus type 2 with complications (HCC)    Environmental allergies    Heart attack (HCC)    2015, DES x 1, xience stent proximal LAD   HTN (hypertension)    Hyperlipemia     PAST SURGICAL HISTORY: Past Surgical History:  Procedure Laterality Date   KNEE ARTHROSCOPY W/ MENISCECTOMY     left knee   LEFT HEART CATHETERIZATION WITH CORONARY ANGIOGRAM N/A 12/06/2013   Procedure: LEFT HEART CATHETERIZATION WITH CORONARY ANGIOGRAM;  Surgeon: [name], MD;  Location:  [facility] CATH LAB;  Service: Cardiovascular;  Laterality: N/A;   PERCUTANEOUS STENT INTERVENTION  12/06/2013   Procedure: PERCUTANEOUS STENT INTERVENTION;  Surgeon: [name], MD;  Location: [facility] CATH LAB;  Service: Cardiovascular;;    FAMILY HISTORY: The patient's family history includes Cancer in his mother; Diabetes in his father; Hyperlipidemia in his father; Hypertension in his father.   SOCIAL HISTORY:  The patient  reports that he quit smoking 7 days ago. His smoking use included cigarettes. He has a 12.5 pack-year smoking history. He has  never used smokeless tobacco. He reports that he does not currently use alcohol. He reports that he does not use drugs.  Review of Systems  Constitutional: Negative for chills and fever.  HENT:  Negative for hoarse voice and nosebleeds.   Eyes:  Negative for discharge, double vision and pain.  Cardiovascular:  Positive for dyspnea on exertion (chronic and stable.). Negative for chest pain, claudication, cyanosis, leg swelling, near-syncope, orthopnea, palpitations, paroxysmal nocturnal dyspnea and syncope.  Respiratory:  Positive for snoring. Negative for hemoptysis and shortness of breath.   Musculoskeletal:  Negative for muscle cramps and myalgias.  Gastrointestinal:  Negative for abdominal pain, constipation, diarrhea, hematemesis, hematochezia, melena, nausea and vomiting.  Neurological:  Negative for dizziness and light-headedness.    PHYSICAL EXAM:    01/13/2023    9:23 AM 07/15/2022    2:16 PM 06/03/2022   11:45 AM  Vitals with BMI  Height 5\' 9"  5\' 9"  5\' 9"   Weight 308 lbs 316 lbs 318 lbs  BMI 45.46 46.64 46.94  Systolic 121 143 161  Diastolic 83 81 72  Pulse 56 57 59    Physical Exam  Constitutional: No distress.  Age appropriate, hemodynamically stable.   Neck:  Still short stature, unable to evaluate JVP due to adipose tissue  Cardiovascular: Normal rate, regular rhythm, S1 normal, S2 normal, intact distal pulses and normal pulses. Exam reveals no gallop, no S3 and no S4.  No murmur heard. Pulmonary/Chest: Effort normal and breath sounds normal. No stridor. He has no wheezes. He has no rales.  Abdominal: Soft. Bowel sounds are normal. He exhibits no distension. There is no abdominal tenderness.  Musculoskeletal:        General: No edema.     Cervical back: Neck supple.  Neurological: He is alert and oriented to person, place, and time. He has intact cranial nerves (2-12).  Skin: Skin is warm and moist.   RADIOLOGY: CT renal stone study 12/30/2019: Aortic  atherosclerosis.  CARDIAC DATABASE: EKG: 02/26/2021: Normal sinus rhythm, 62 bpm, without underlying ischemia or injury pattern January 13, 2023: Sinus bradycardia, 53 bpm, nonspecific T abnormality.  Echocardiogram: 12/16/2018 :  Normal LV systolic function with visual EF 55-60%. Left ventricle cavity is normal in size. Mild concentric hypertrophy of the left ventricle. Normal global wall motion. Normal diastolic filling pattern. No significant valvular abnormality.  09/02/2022: Normal LV systolic function with visual EF 60-65%. Left ventricle cavity is normal in size. Normal left ventricular wall thickness. Normal global wall motion. Normal diastolic filling pattern, normal LAP. Calculated EF 63%. Structurally normal tricuspid valve with no regurgitation. No evidence of pulmonary hypertension. no significant change compared to 11/2018.   Stress Testing:  Treadmill stress test  [12/24/2013]: Indications: Assessment of Chest Pain. Conclusions: Negative for ischemia. Continue primary prevention The patient exercised according to the Bruce protocol, Total time recorded 9 Min. 0 sec. achieving a max heart rate of 164 which was 93% of MPHR for age and  10.0 METS of work. Baseline NIBP was 122/74. Peak NIBP was 122/74 MaxSysp was: 122 MaxDiasp was: 74.The baseline ECG showed NSR,LAD, LAFB. During exercise there was no ST-T changes of ischemia. Symptoms: THR met. Mild dyspnea. Arrhythmia: Rare PAC  Heart Catheterization: Coronary Angiogram 12/03/2013: S/P 3 x 33 mm Xience DES proximal LAD on 12/06/2013. Mild disease in other vessels and mild ectasion in distal RCA.  LABORATORY DATA:    Latest Ref Rng & Units 05/30/2022    3:01 PM 10/15/2021    4:54 AM 01/01/2021    9:42 AM  CBC  WBC 3.8 - 10.8 Thousand/uL 10.3  11.2  7.2   Hemoglobin 13.2 - 17.1 g/dL 16.1  09.6  04.5   Hematocrit 38.5 - 50.0 % 44.5  47.3  41.3   Platelets 140 - 400 Thousand/uL 213  203  183        Latest Ref Rng & Units  05/30/2022    3:01 PM 10/15/2021    4:54 AM 01/01/2021    9:42 AM  CMP  Glucose 65 - 99 mg/dL 409  811  914   BUN 7 - 25 mg/dL 11  15  7    Creatinine 0.70 - 1.30 mg/dL 7.82  9.56  2.13   Sodium 135 - 146 mmol/L 142  137  141   Potassium 3.5 - 5.3 mmol/L 3.8  4.0  3.9   Chloride 98 - 110 mmol/L 104  106  106   CO2 20 - 32 mmol/L 28  25  27    Calcium 8.6 - 10.3 mg/dL 9.3  8.8  8.4   Total Protein 6.1 - 8.1 g/dL 6.2  6.2  6.0   Total Bilirubin 0.2 - 1.2 mg/dL 0.5  0.4  0.4   Alkaline Phos 38 - 126 U/L  110    AST 10 - 35 U/L 18  18  25    ALT 9 - 46 U/L 30  25  38     Lipid Panel     Component Value Date/Time   CHOL 98 05/30/2022 1501   TRIG 156 (H) 05/30/2022 1501   HDL 30 (L) 05/30/2022 1501   CHOLHDL 3.3 05/30/2022 1501   VLDL 36 12/04/2013 0310   LDLCALC 45 05/30/2022 1501    Lab Results  Component Value Date   HGBA1C 7.0 (H) 05/30/2022   HGBA1C 9.8 (H) 01/01/2021   HGBA1C 6.6 (H) 04/06/2019   No components found for: "NTPROBNP" No results found for: "TSH"  Cardiac Panel (last 3 results) No results for input(s): "CKTOTAL", "CKMB", "TROPONINIHS", "RELINDX" in the last 72 hours.  IMPRESSION:    ICD-10-CM   1. Atherosclerosis of native coronary artery of native heart without angina pectoris  I25.10 EKG 12-Lead    Semaglutide,0.25 or 0.5MG /DOS, (OZEMPIC, 0.25 OR 0.5 MG/DOSE,) 2 MG/1.5ML SOPN    2. NSTEMI (non-ST elevated myocardial infarction) (HCC)  I21.4 Semaglutide,0.25 or 0.5MG /DOS, (OZEMPIC, 0.25 OR 0.5 MG/DOSE,) 2 MG/1.5ML SOPN    3. History of coronary angioplasty with insertion of stent  Z95.5     4. Diabetes mellitus type 2 with complications (HCC)  E11.8 Semaglutide,0.25 or 0.5MG /DOS, (OZEMPIC, 0.25 OR 0.5 MG/DOSE,) 2 MG/1.5ML SOPN    5. Essential hypertension  I10     6. Mixed hyperlipidemia  E78.2     7. Tobacco use  Z72.0         RECOMMENDATIONS: Manuel Carey is a 52 y.o. male whose past medical history and cardiovascular risk factors  include: hypertension, hyperlipidemia, ongoing  tobacco use, diabetes,  CAD and NSTEMI on 12/03/2013 S/P  3 x 33 mm Xience DES proximal LAD on  12/06/2013.   Atherosclerosis of native coronary artery of native heart without angina pectoris NSTEMI (non-ST elevated myocardial infarction) (HCC) History of coronary angioplasty with insertion of stent Currently denies anginal chest pain or heart failure symptoms EKG is nonischemic. Echocardiogram noted preserved LVEF without any significant valvular heart disease. Last ischemic workup in 2015.  Did offer a repeat ischemic workup as far as follow-up but due to him being asymptomatic and cost shared decision was to hold off for now. Reemphasized the importance of secondary prevention with focus on improving her modifiable cardiovascular risk factors such as glycemic control, lipid management, blood pressure control, weight loss.  Diabetes mellitus type 2 with complications [facility]) Reemphasized importance of glycemic control Shared decision was to start Ozempic 0.25 mg subcu injection weekly in 4 weeks can increase to 0.5 mg subcutaneous injection. q. Weekly He will follow back in the office in 8 weeks. Medication profile discussed  Essential hypertension Office blood pressures are well-controlled. No medication changes warranted at this time  Mixed hyperlipidemia Currently on atorvastatin, fenofibrate Currently managed by primary care provider.  Tobacco use Has stopped smoking for the last 1.5 weeks.   He is congratulated on his efforts. Reemphasized importance of continued complete cessation of nicotine products    FINAL MEDICATION LIST END OF ENCOUNTER: Meds ordered this encounter  Medications   Semaglutide,0.25 or 0.5MG /DOS, (OZEMPIC, 0.25 OR 0.5 MG/DOSE,) 2 MG/1.5ML SOPN    Sig: Inject 0.25 mg into the skin once a week for 8 doses.    Dispense:  0.75 mL    Refill:  1      Current Outpatient Medications:    aspirin 81 MG chewable  tablet, Chew 1 tablet (81 mg total) by mouth daily., Disp: , Rfl:    atorvastatin (LIPITOR) 80 MG tablet, TAKE 1 TABLET BY MOUTH  DAILY (Patient taking differently: Take 80 mg by mouth at bedtime.), Disp: 90 tablet, Rfl: 3   fenofibrate (TRICOR) 145 MG tablet, TAKE 1 TABLET BY MOUTH EVERY DAY, Disp: 90 tablet, Rfl: 2   JARDIANCE 25 MG TABS tablet, TAKE 1 TABLET BY MOUTH DAILY  BEFORE BREAKFAST, Disp: 90 tablet, Rfl: 3   losartan (COZAAR) 25 MG tablet, TAKE 1 TABLET BY MOUTH DAILY, Disp: 90 tablet, Rfl: 3   meloxicam (MOBIC) 15 MG tablet, Take 15 mg by mouth daily., Disp: , Rfl:    metoprolol succinate (TOPROL-XL) 25 MG 24 hr tablet, Take 1 tablet (25 mg total) by mouth daily., Disp: 90 tablet, Rfl: 1   nitroGLYCERIN (NITROSTAT) 0.4 MG SL tablet, Place 1 tablet (0.4 mg total) under the tongue every 5 (five) minutes as needed for up to 30 doses for chest pain., Disp: 30 tablet, Rfl: 0   Semaglutide,0.25 or 0.5MG /DOS, (OZEMPIC, 0.25 OR 0.5 MG/DOSE,) 2 MG/1.5ML SOPN, Inject 0.25 mg into the skin once a week for 8 doses., Disp: 0.75 mL, Rfl: 1  Orders Placed This Encounter  Procedures   EKG 12-Lead   --Continue cardiac medications as reconciled in final medication list. --Return in about 8 weeks (around 03/10/2023) for Follow up CAD, DM, Ozempic initiation. Or sooner if needed. --Continue follow-up with your primary care physician regarding the management of your other chronic comorbid conditions.  Patient's questions and concerns were addressed to his satisfaction. He voices understanding of the instructions provided during this encounter.   This note was created using a voice  recognition software as a result there may be grammatical errors inadvertently enclosed that do not reflect the nature of this encounter. Every attempt is made to correct such errors.  Tessa Lerner, [place], [facility]  Pager: [phone] Office: [phone]

## 2023-01-28 ENCOUNTER — Other Ambulatory Visit: Payer: Self-pay | Admitting: Cardiology

## 2023-01-28 DIAGNOSIS — I2510 Atherosclerotic heart disease of native coronary artery without angina pectoris: Secondary | ICD-10-CM

## 2023-02-25 ENCOUNTER — Other Ambulatory Visit: Payer: Self-pay | Admitting: Cardiology

## 2023-03-06 ENCOUNTER — Telehealth: Payer: Self-pay | Admitting: Pharmacist

## 2023-03-06 ENCOUNTER — Other Ambulatory Visit: Payer: Self-pay | Admitting: Cardiology

## 2023-03-06 DIAGNOSIS — I214 Non-ST elevation (NSTEMI) myocardial infarction: Secondary | ICD-10-CM

## 2023-03-06 DIAGNOSIS — I2510 Atherosclerotic heart disease of native coronary artery without angina pectoris: Secondary | ICD-10-CM

## 2023-03-06 DIAGNOSIS — E118 Type 2 diabetes mellitus with unspecified complications: Secondary | ICD-10-CM

## 2023-03-06 NOTE — Telephone Encounter (Addendum)
Dr Odis Hollingshead pt, called pt and left message to clarify tolerability of Ozempic 0.5mg  for potential dose increase. Of note, med fell off his med list because there was an end date noted after 8 doses.

## 2023-03-06 NOTE — Telephone Encounter (Addendum)
Received refill request for Ozempic. Pt was prescribed 8 doses of Ozempic 0.5mg . Left message for pt, if tolerating well would recommend dose titration due to obesity and elevated A1c.

## 2023-03-10 MED ORDER — SEMAGLUTIDE(0.25 OR 0.5MG/DOS) 2 MG/3ML ~~LOC~~ SOPN: 0.5000 mg | PEN_INJECTOR | SUBCUTANEOUS | 0 refills | Status: DC

## 2023-03-10 NOTE — Telephone Encounter (Signed)
2nd message left.

## 2023-03-10 NOTE — Telephone Encounter (Signed)
Spoke with pt, he thinks he's given 6 injections of the 0.25mg  dose, was off it for a few weeks when he had COVID. He is tolerating well, will refill for a month of 0.5mg  then continue with monthly dose titrations.

## 2023-03-10 NOTE — Telephone Encounter (Signed)
2nd message left for pt.

## 2023-03-17 ENCOUNTER — Ambulatory Visit: Payer: 59 | Admitting: Cardiology

## 2023-04-07 ENCOUNTER — Ambulatory Visit: Payer: 59 | Attending: Cardiology | Admitting: Cardiology

## 2023-04-07 ENCOUNTER — Telehealth: Payer: Self-pay | Admitting: Pharmacist

## 2023-04-07 ENCOUNTER — Encounter: Payer: Self-pay | Admitting: Cardiology

## 2023-04-07 VITALS — BP 112/72 | HR 54 | Ht 69.0 in | Wt 301.4 lb

## 2023-04-07 DIAGNOSIS — I214 Non-ST elevation (NSTEMI) myocardial infarction: Secondary | ICD-10-CM

## 2023-04-07 DIAGNOSIS — I2510 Atherosclerotic heart disease of native coronary artery without angina pectoris: Secondary | ICD-10-CM | POA: Diagnosis not present

## 2023-04-07 DIAGNOSIS — E118 Type 2 diabetes mellitus with unspecified complications: Secondary | ICD-10-CM

## 2023-04-07 DIAGNOSIS — I10 Essential (primary) hypertension: Secondary | ICD-10-CM

## 2023-04-07 DIAGNOSIS — Z720 Tobacco use: Secondary | ICD-10-CM

## 2023-04-07 DIAGNOSIS — Z955 Presence of coronary angioplasty implant and graft: Secondary | ICD-10-CM | POA: Diagnosis not present

## 2023-04-07 DIAGNOSIS — E782 Mixed hyperlipidemia: Secondary | ICD-10-CM

## 2023-04-07 NOTE — Patient Instructions (Signed)
Medication Instructions:  Your physician recommends that you continue on your current medications as directed. Please refer to the Current Medication list given to you today.  *If you need a refill on your cardiac medications before your next appointment, please call your pharmacy*  Lab Work: None ordered today. If you have labs (blood work) drawn today and your tests are completely normal, you will receive your results only by: MyChart Message (if you have MyChart) OR A paper copy in the mail If you have any lab test that is abnormal or we need to change your treatment, we will call you to review the results.  Testing/Procedures: None ordered today.  Follow-Up: At [facility], you and your health needs are our priority.  As part of our continuing mission to provide you with exceptional heart care, we have created designated Provider Care Teams.  These Care Teams include your primary Cardiologist (physician) and Advanced Practice Providers (APPs -  Physician Assistants and Nurse Practitioners) who all work together to provide you with the care you need, when you need it.  You have been referred to PharmD to meet with one of our pharmacists for Ozempic titration.    Your next appointment:   6 month(s)  The format for your next appointment:   In Person  Provider:   Tessa Lerner, DO {

## 2023-04-07 NOTE — Progress Notes (Signed)
Cardiology Office Note:  .   Date:  04/07/2023  ID:  Manuel Carey, DOB 14-Sep-1970, MRN [id number] PCP:  Donita Brooks, MD  Former Cardiology Providers: Altamese Carthage, APRN, FNP-C, Dr. Yates Decamp  [facility] Providers Cardiologist:  Tessa Lerner, DO , [facility] (established care 01/06/2020) Electrophysiologist:  None  Click to update primary MD,subspecialty MD or APP then REFRESH:1}    Chief Complaint  Patient presents with   Follow-up    Cad     History of Present Illness: Manuel Carey Kitchen   Manuel Carey is a 52 y.o. Caucasian male whose past medical history and cardiovascular risk factors includes: Hx of COVID (01/2021), hypertension, hyperlipidemia, ongoing tobacco use, diabetes,  CAD and NSTEMI on 12/03/2013 S/P  3 x 33 mm Xience DES proximal LAD on  12/06/2013.   Patient being followed by the practice given his history of CAD/prior PCI/history of NSTEMI.  He presents today for 25-month follow-up visit.  In the past he was recommended to undergo ischemic workup as a last study was in 2015.  However due to finances limitation and his wishes patient would like to hold off.  At last office visit he has stopped smoking for about 1.5 weeks.  We also decided to start Ozempic at the last office visit and he is here for follow-up.  Has been on Ozempic for the last 6 to 7 weeks at 0.25 mg dosage and doing well.  He has lost about 7 pounds due to the medication and also lifestyle changes.  Unfortunately still continues to smoke but has significantly cut down.  Since last office visit patient states that he probably has had 3 cigarettes.  He is making a conscious effort with regards to smoking cessation for which I have congratulated him.   Review of Systems: .   Review of Systems  Constitutional: Positive for weight loss.  Cardiovascular:  Negative for chest pain, claudication, irregular heartbeat, leg swelling, near-syncope, orthopnea, palpitations, paroxysmal nocturnal dyspnea and syncope.   Respiratory:  Negative for shortness of breath.   Hematologic/Lymphatic: Negative for bleeding problem.    Studies Reviewed:   EKG: January 13, 2023: Sinus bradycardia, 53 bpm, nonspecific T abnormality.   Echocardiogram: April 2024: LVEF 60-65%, normal diastolic function, see report for additional details  Stress Testing: Treadmill stress test August 2015: Negative for ischemia.  See report for additional details  Coronary Angiogram 12/03/2013: S/P 3 x 33 mm Xience DES proximal LAD on 12/06/2013. Mild disease in other vessels and mild ectasion in distal RCA.   RADIOLOGY: CT renal stone study 12/30/2019: Aortic atherosclerosis.  Risk Assessment/Calculations:   NA   Labs:       Latest Ref Rng & Units 05/30/2022    3:01 PM 10/15/2021    4:54 AM 01/01/2021    9:42 AM  CBC  WBC 3.8 - 10.8 Thousand/uL 10.3  11.2  7.2   Hemoglobin 13.2 - 17.1 g/dL 08.6  57.8  46.9   Hematocrit 38.5 - 50.0 % 44.5  47.3  41.3   Platelets 140 - 400 Thousand/uL 213  203  183        Latest Ref Rng & Units 05/30/2022    3:01 PM 10/15/2021    4:54 AM 01/01/2021    9:42 AM  BMP  Glucose 65 - 99 mg/dL 629  528  413   BUN 7 - 25 mg/dL 11  15  7    Creatinine 0.70 - 1.30 mg/dL 2.44  0.10  2.72  BUN/Creat Ratio 6 - 22 (calc) SEE NOTE:   NOT APPLICABLE   Sodium 135 - 146 mmol/L 142  137  141   Potassium 3.5 - 5.3 mmol/L 3.8  4.0  3.9   Chloride 98 - 110 mmol/L 104  106  106   CO2 20 - 32 mmol/L 28  25  27    Calcium 8.6 - 10.3 mg/dL 9.3  8.8  8.4       Latest Ref Rng & Units 05/30/2022    3:01 PM 10/15/2021    4:54 AM 01/01/2021    9:42 AM  CMP  Glucose 65 - 99 mg/dL 578  469  629   BUN 7 - 25 mg/dL 11  15  7    Creatinine 0.70 - 1.30 mg/dL 5.28  4.13  2.44   Sodium 135 - 146 mmol/L 142  137  141   Potassium 3.5 - 5.3 mmol/L 3.8  4.0  3.9   Chloride 98 - 110 mmol/L 104  106  106   CO2 20 - 32 mmol/L 28  25  27    Calcium 8.6 - 10.3 mg/dL 9.3  8.8  8.4   Total Protein 6.1 - 8.1 g/dL 6.2  6.2  6.0    Total Bilirubin 0.2 - 1.2 mg/dL 0.5  0.4  0.4   Alkaline Phos 38 - 126 U/L  110    AST 10 - 35 U/L 18  18  25    ALT 9 - 46 U/L 30  25  38     Lab Results  Component Value Date   CHOL 98 05/30/2022   HDL 30 (L) 05/30/2022   LDLCALC 45 05/30/2022   TRIG 156 (H) 05/30/2022   CHOLHDL 3.3 05/30/2022   No results for input(s): "LIPOA" in the last 8760 hours. No components found for: "NTPROBNP" No results for input(s): "PROBNP" in the last 8760 hours. No results for input(s): "TSH" in the last 8760 hours.  Physical Exam:    Today's Vitals   04/07/23 0817  BP: 112/72  Pulse: (!) 54  SpO2: 96%  Weight: (!) 301 lb 6.4 oz (136.7 kg)  Height: 5\' 9"  (1.753 m)   Body mass index is 44.51 kg/m. Wt Readings from Last 3 Encounters:  04/07/23 (!) 301 lb 6.4 oz (136.7 kg)  01/13/23 (!) 308 lb (139.7 kg)  07/15/22 (!) 316 lb (143.3 kg)    Physical Exam  Constitutional: No distress.  Age appropriate, hemodynamically stable.   Neck:  Still short stature, unable to evaluate JVP due to adipose tissue  Cardiovascular: Normal rate, regular rhythm, S1 normal, S2 normal, intact distal pulses and normal pulses. Exam reveals no gallop, no S3 and no S4.  No murmur heard. Pulmonary/Chest: Effort normal and breath sounds normal. No stridor. He has no wheezes. He has no rales.  Abdominal: Soft. Bowel sounds are normal. He exhibits no distension. There is no abdominal tenderness.  Musculoskeletal:        General: No edema.     Cervical back: Neck supple.  Neurological: He is alert and oriented to person, place, and time. He has intact cranial nerves (2-12).  Skin: Skin is warm and moist.     Impression & Recommendation(s):  Impression:   ICD-10-CM   1. Atherosclerosis of native coronary artery of native heart without angina pectoris  I25.10     2. NSTEMI (non-ST elevated myocardial infarction) (HCC)  I21.4     3. History of coronary angioplasty with insertion of stent  Z95.5  4.  Diabetes mellitus type 2 with complications (HCC)  E11.8 AMB Referral to [facility] Pharm-D    5. Essential hypertension  I10     6. Mixed hyperlipidemia  E78.2     7. Tobacco use  Z72.0        Recommendation(s):  Atherosclerosis of native coronary artery of native heart without angina pectoris NSTEMI (non-ST elevated myocardial infarction) (HCC) History of coronary angioplasty with insertion of stent Denies anginal chest pain or heart failure symptoms. No use of sublingual nitroglycerin tablets since the last office visit. Prior echocardiogram noted preserved LVEF without any significant valvular heart disease. Last ischemic workup 2015-patient would like to hold off for now as he is asymptomatic. He is lost additional 7 pounds since last office visit for which I congratulated him for. He is making a conscious effort with regards to complete smoking cessation. Reemphasized the importance of secondary prevention with focus on improving her modifiable cardiovascular risk factors such as glycemic control, lipid management, blood pressure control, weight loss.  Diabetes mellitus type 2 with complications (HCC) Reemphasized the importance of glycemic control. Tolerating Ozempic well.  Have asked him to increase the dose to 0.5 mg subcutaneous injections for the next 4 weeks.  I will have him connected to the pharmacy group to help uptitrate medical therapy  Essential hypertension Office blood pressures are very well-controlled. Continue Jardiance, losartan, Toprol-XL  Mixed hyperlipidemia Currently on atorvastatin 80 mg p.o. nightly.  Fenofibrate 245 mg p.o. daily. Has lost weight since last visit as discussed above. No recent labs available for review. Denies any myalgias.  Tobacco use Making a conscious effort. Only 3 cigarettes over the last 3 months. Educated him on importance of complete smoking cessation.  Orders Placed:  Orders Placed This Encounter  Procedures   AMB  Referral to [facility] Pharm-D    Referral Priority:   Routine    Referral Type:   Consultation    Referral Reason:   Specialty Services Required    Number of Visits Requested:   1   Final Medication List:   No orders of the defined types were placed in this encounter.   Medications Discontinued During This Encounter  Medication Reason   meloxicam (MOBIC) 15 MG tablet      Current Outpatient Medications:    aspirin 81 MG chewable tablet, Chew 1 tablet (81 mg total) by mouth daily., Disp: , Rfl:    atorvastatin (LIPITOR) 80 MG tablet, TAKE 1 TABLET BY MOUTH ONCE  DAILY, Disp: 90 tablet, Rfl: 3   fenofibrate (TRICOR) 145 MG tablet, TAKE 1 TABLET BY MOUTH EVERY DAY, Disp: 90 tablet, Rfl: 2   JARDIANCE 25 MG TABS tablet, TAKE 1 TABLET BY MOUTH DAILY  BEFORE BREAKFAST, Disp: 90 tablet, Rfl: 3   losartan (COZAAR) 25 MG tablet, TAKE 1 TABLET BY MOUTH DAILY, Disp: 90 tablet, Rfl: 3   metoprolol succinate (TOPROL-XL) 25 MG 24 hr tablet, TAKE 1 TABLET (25 MG TOTAL) BY MOUTH DAILY., Disp: 90 tablet, Rfl: 1   nitroGLYCERIN (NITROSTAT) 0.4 MG SL tablet, Place 1 tablet (0.4 mg total) under the tongue every 5 (five) minutes as needed for up to 30 doses for chest pain., Disp: 30 tablet, Rfl: 0   Semaglutide,0.25 or 0.5MG /DOS, 2 MG/3ML SOPN, Inject 0.5 mg into the skin once a week., Disp: 3 mL, Rfl: 0  Consent:   N/A  Disposition:   6 months sooner if needed -CAD, history of NSTEMI, prior stents Patient may be asked to follow-up sooner  based on the results of the above-mentioned testing.  His questions and concerns were addressed to his satisfaction. He voices understanding of the recommendations provided during this encounter.    Signed, Tessa Lerner, DO, [facility] [place]  [facility]  [address] [place], [place] [address] 04/07/2023 10:35 AM

## 2023-04-07 NOTE — Telephone Encounter (Signed)
Spoke with pt about ozempic. He has not increased to 0.5mg  yet. He will today. I will f/u with him in another 4 weeks to discuss going to 1mg .  He was referred back to PharmD clinic and apt was made. He saw Dr. Odis Hollingshead today. Apt not needed. Will f/u via telephone. Apt with pharmD canceled.

## 2023-04-09 ENCOUNTER — Other Ambulatory Visit: Payer: Self-pay | Admitting: Cardiology

## 2023-04-09 DIAGNOSIS — E118 Type 2 diabetes mellitus with unspecified complications: Secondary | ICD-10-CM

## 2023-05-05 MED ORDER — SEMAGLUTIDE (1 MG/DOSE) 4 MG/3ML ~~LOC~~ SOPN: 1.0000 mg | PEN_INJECTOR | SUBCUTANEOUS | 0 refills | Status: DC

## 2023-05-05 NOTE — Telephone Encounter (Signed)
Called pt to follow up on if he increased ozempic to 0.5mg  and how he was doing. LVM for pt to call back.

## 2023-05-05 NOTE — Telephone Encounter (Signed)
Spoke with patient. Will increase to 1mg  weekly. No issues with the ozempic thus far. He thinks he has given 4 injections. Also thinks he has more injections left. Ok to give 2 injections of 0.5mg .

## 2023-05-05 NOTE — Addendum Note (Signed)
Addended by: Malena Peer D on: 05/05/2023 12:53 PM   Modules accepted: Orders

## 2023-05-09 ENCOUNTER — Other Ambulatory Visit: Payer: Self-pay | Admitting: Cardiology

## 2023-05-09 DIAGNOSIS — E118 Type 2 diabetes mellitus with unspecified complications: Secondary | ICD-10-CM

## 2023-05-12 ENCOUNTER — Ambulatory Visit: Payer: 59

## 2023-06-12 MED ORDER — SEMAGLUTIDE (1 MG/DOSE) 4 MG/3ML ~~LOC~~ SOPN: 1.0000 mg | PEN_INJECTOR | SUBCUTANEOUS | 11 refills | Status: DC

## 2023-06-12 NOTE — Telephone Encounter (Signed)
Spoke to patient.  He is doing well on Ozempic 1 mg weekly.  A little bit of nausea and lightheadedness the other day not sure if it was from the medicine or not.  Checked his blood sugar the other day after eating it was about 100.  Has a physical coming up with his PCP soon.  He would like to stay at 1 mg.  Advised I will send in prescription with refills and he will contact us if he wants to go up to 2 mg at any point.

## 2023-06-12 NOTE — Addendum Note (Signed)
Addended by: Malena Peer D on: 06/12/2023 02:10 PM   Modules accepted: Orders

## 2023-06-16 ENCOUNTER — Telehealth: Payer: 59

## 2023-06-16 DIAGNOSIS — R0789 Other chest pain: Secondary | ICD-10-CM

## 2023-06-17 ENCOUNTER — Ambulatory Visit
Admission: EM | Admit: 2023-06-17 | Discharge: 2023-06-17 | Disposition: A | Discharge status: Home / Self Care | Payer: 59 | Attending: Nurse Practitioner | Admitting: Nurse Practitioner

## 2023-06-17 DIAGNOSIS — R059 Cough, unspecified: Secondary | ICD-10-CM | POA: Diagnosis not present

## 2023-06-17 DIAGNOSIS — J101 Influenza due to other identified influenza virus with other respiratory manifestations: Secondary | ICD-10-CM

## 2023-06-17 DIAGNOSIS — R062 Wheezing: Secondary | ICD-10-CM

## 2023-06-17 LAB — POCT INFLUENZA A/B
Influenza A, POC: POSITIVE — AB
Influenza B, POC: NEGATIVE

## 2023-06-17 MED ORDER — PROMETHAZINE-DM 6.25-15 MG/5ML PO SYRP: 5.0000 mL | ORAL_SOLUTION | Freq: Four times a day (QID) | ORAL | 0 refills | Status: DC | PRN

## 2023-06-17 MED ORDER — IPRATROPIUM-ALBUTEROL 0.5-2.5 (3) MG/3ML IN SOLN
3.0000 mL | Freq: Once | RESPIRATORY_TRACT | Status: AC
  Administered 2023-06-17: 3 mL via RESPIRATORY_TRACT

## 2023-06-17 MED ORDER — PREDNISONE 20 MG PO TABS: 20.0000 mg | ORAL_TABLET | Freq: Every day | ORAL | 0 refills | Status: AC

## 2023-06-17 MED ORDER — ALBUTEROL SULFATE HFA 108 (90 BASE) MCG/ACT IN AERS: 2.0000 | INHALATION_SPRAY | Freq: Four times a day (QID) | RESPIRATORY_TRACT | 0 refills | Status: AC | PRN

## 2023-06-17 MED ORDER — OSELTAMIVIR PHOSPHATE 75 MG PO CAPS: 75.0000 mg | ORAL_CAPSULE | Freq: Two times a day (BID) | ORAL | 0 refills | Status: DC

## 2023-06-17 NOTE — ED Triage Notes (Signed)
Pt reports he has some chest congestion, wheezing, and painful cough x 2 days

## 2023-06-17 NOTE — Progress Notes (Signed)
  Because of severe chest pain and need for examination as well as testing, I feel your condition warrants further evaluation and I recommend that you be seen in a face-to-face visit.   NOTE: There will be NO CHARGE for this E-Visit   If you are having a true medical emergency, please call 911.     For an urgent face to face visit, [facility] has multiple urgent care centers for your convenience.  Click the link below for the full list of locations and hours, walk-in wait times, appointment scheduling options and driving directions:  Urgent Care - [place], [place], [place], [place], [place], [place]  [place]     Your MyChart E-visit questionnaire answers were reviewed by a board certified advanced clinical practitioner to complete your personal care plan based on your specific symptoms.    Thank you for using e-Visits.

## 2023-06-17 NOTE — ED Provider Notes (Signed)
[facility]    CSN: [id number] Arrival date & time: 06/17/23  1643      History   Chief Complaint No chief complaint on file.   HPI Manuel Carey is a 53 y.o. male.   The history is provided by the patient.   Patient presents with a 2-day history of cough, wheezing, and chest congestion.  Patient states when he coughs, he has pain in his chest.  States that he does not have pain unless he is coughing.  Denies fever, chills, headache, ear pain, shortness of breath, difficulty breathing, abdominal pain, nausea, vomiting, diarrhea, or rash.  Patient reports he has been taking over-the-counter cough and cold medications for symptoms.  Patient with past medical history of type 2 diabetes.  A1c in January 2024 was 7.  States he stopped smoking approximately 2 to 3 days ago.  Patient states he took a home COVID test which was negative.  Past Medical History:  Diagnosis Date   Coronary artery disease    Diabetes mellitus type 2 with complications (HCC)    Environmental allergies    Heart attack (HCC)    2015, DES x 1, xience stent proximal LAD   HTN (hypertension)    Hyperlipemia     Patient Active Problem List   Diagnosis Date Noted   Diabetes mellitus type 2 with complications (HCC)    CAD (coronary artery disease) 03/30/2019   Prediabetes    Heart attack (HCC)    NSTEMI (non-ST elevated myocardial infarction) (HCC) 12/04/2013    Past Surgical History:  Procedure Laterality Date   KNEE ARTHROSCOPY W/ MENISCECTOMY     left knee   LEFT HEART CATHETERIZATION WITH CORONARY ANGIOGRAM N/A 12/06/2013   Procedure: LEFT HEART CATHETERIZATION WITH CORONARY ANGIOGRAM;  Surgeon: [name], MD;  Location: [facility] CATH LAB;  Service: Cardiovascular;  Laterality: N/A;   PERCUTANEOUS STENT INTERVENTION  12/06/2013   Procedure: PERCUTANEOUS STENT INTERVENTION;  Surgeon: [name], MD;  Location: [facility] CATH LAB;  Service: Cardiovascular;;       Home Medications     Prior to Admission medications   Medication Sig Start Date End Date Taking? Authorizing Provider  albuterol (VENTOLIN HFA) 108 (90 Base) MCG/ACT inhaler Inhale 2 puffs into the lungs every 6 (six) hours as needed. 06/17/23  Yes Leath-Warren, Sadie Haber, NP  oseltamivir (TAMIFLU) 75 MG capsule Take 1 capsule (75 mg total) by mouth every 12 (twelve) hours. 06/17/23  Yes Leath-Warren, Sadie Haber, NP  predniSONE (DELTASONE) 20 MG tablet Take 1 tablet (20 mg total) by mouth daily with breakfast for 7 days. 06/17/23 06/24/23 Yes Leath-Warren, Sadie Haber, NP  promethazine-dextromethorphan (PROMETHAZINE-DM) 6.25-15 MG/5ML syrup Take 5 mLs by mouth 4 (four) times daily as needed. 06/17/23  Yes Leath-Warren, Sadie Haber, NP  aspirin 81 MG chewable tablet Chew 1 tablet (81 mg total) by mouth daily. 12/07/13   Yates Decamp, MD  atorvastatin (LIPITOR) 80 MG tablet TAKE 1 TABLET BY MOUTH ONCE  DAILY 02/26/23   Tolia, Sunit, DO  fenofibrate (TRICOR) 145 MG tablet TAKE 1 TABLET BY MOUTH EVERY DAY 10/30/22   Tolia, Sunit, DO  JARDIANCE 25 MG TABS tablet TAKE 1 TABLET BY MOUTH DAILY  BEFORE BREAKFAST 08/08/22   Donita Brooks, MD  losartan (COZAAR) 25 MG tablet TAKE 1 TABLET BY MOUTH DAILY 10/30/22   Tolia, Sunit, DO  metoprolol succinate (TOPROL-XL) 25 MG 24 hr tablet TAKE 1 TABLET (25 MG TOTAL) BY MOUTH DAILY. 01/28/23 07/27/23  Odis Hollingshead,  Sunit, DO  nitroGLYCERIN (NITROSTAT) 0.4 MG SL tablet Place 1 tablet (0.4 mg total) under the tongue every 5 (five) minutes as needed for up to 30 doses for chest pain. 07/15/22   Tolia, Sunit, DO  Semaglutide, 1 MG/DOSE, 4 MG/3ML SOPN Inject 1 mg into the skin once a week. 06/12/23   Tessa Lerner, DO    Family History Family History  Problem Relation Age of Onset   Cancer Mother        breast   Diabetes Father    Hyperlipidemia Father    Hypertension Father     Social History Social History   Tobacco Use   Smoking status: Former    Current packs/day: 0.00    Average packs/day:  0.5 packs/day for 25.0 years (12.5 ttl pk-yrs)    Types: Cigarettes    Quit date: 01/06/2023    Years since quitting: 0.4   Smokeless tobacco: Never  Vaping Use   Vaping status: Never Used  Substance Use Topics   Alcohol use: Not Currently   Drug use: Never     Allergies   Patient has no known allergies.   Review of Systems Review of Systems Per HPI with  Physical Exam Triage Vital Signs ED Triage Vitals  Encounter Vitals Group     BP 06/17/23 1651 137/86     Systolic BP Percentile --      Diastolic BP Percentile --      Pulse Rate 06/17/23 1651 81     Resp 06/17/23 1651 14     Temp 06/17/23 1651 98.5 F (36.9 C)     Temp Source 06/17/23 1651 Oral     SpO2 06/17/23 1651 93 %     Weight --      Height --      Head Circumference --      Peak Flow --      Pain Score 06/17/23 1652 0     Pain Loc --      Pain Education --      Exclude from Growth Chart --    No data found.  Updated Vital Signs BP 137/86 (BP Location: Right Arm)   Pulse 69   Temp 98.5 F (36.9 C) (Oral)   Resp 14   SpO2 97%   Visual Acuity Right Eye Distance:   Left Eye Distance:   Bilateral Distance:    Right Eye Near:   Left Eye Near:    Bilateral Near:     Physical Exam Vitals and nursing note reviewed.  Constitutional:      General: He is not in acute distress.    Appearance: Normal appearance.  HENT:     Head: Normocephalic.     Right Ear: Tympanic membrane, ear canal and external ear normal.     Left Ear: Tympanic membrane, ear canal and external ear normal.     Nose: Congestion present.     Right Turbinates: Enlarged and swollen.     Left Turbinates: Enlarged and swollen.     Right Sinus: No maxillary sinus tenderness or frontal sinus tenderness.     Left Sinus: No maxillary sinus tenderness or frontal sinus tenderness.     Mouth/Throat:     Mouth: Mucous membranes are moist.  Eyes:     Extraocular Movements: Extraocular movements intact.     Conjunctiva/sclera:  Conjunctivae normal.     Pupils: Pupils are equal, round, and reactive to light.  Cardiovascular:     Rate and Rhythm: Normal rate  and regular rhythm.     Pulses: Normal pulses.     Heart sounds: Normal heart sounds.  Pulmonary:     Effort: Pulmonary effort is normal. No respiratory distress.     Breath sounds: No stridor. Examination of the right-upper field reveals decreased breath sounds. Examination of the right-lower field reveals wheezing. Examination of the left-lower field reveals wheezing. Decreased breath sounds, wheezing and rhonchi present. No rales.  Chest:     Chest wall: No tenderness.  Abdominal:     General: Bowel sounds are normal.     Palpations: Abdomen is soft.  Musculoskeletal:     Cervical back: Normal range of motion.  Skin:    General: Skin is warm and dry.  Neurological:     General: No focal deficit present.     Mental Status: He is alert and oriented to person, place, and time.  Psychiatric:        Mood and Affect: Mood normal.        Behavior: Behavior normal.      UC Treatments / Results  Labs (all labs ordered are listed, but only abnormal results are displayed) Labs Reviewed  POCT INFLUENZA A/B - Abnormal; Notable for the following components:      Result Value   Influenza A, POC Positive (*)    All other components within normal limits    EKG   Radiology No results found.  Procedures Procedures (including critical care time)  Medications Ordered in UC Medications  ipratropium-albuterol (DUONEB) 0.5-2.5 (3) MG/3ML nebulizer solution 3 mL (3 mLs Nebulization Given 06/17/23 1711)    Initial Impression / Assessment and Plan / UC Course  I have reviewed the triage vital signs and the nursing notes.  Pertinent labs & imaging results that were available during my care of the patient were reviewed by me and considered in my medical decision making (see chart for details).  On exam, patient with rhonchi and wheezing, room air sat at 97%.   DuoNeb was administered with good improvement of his rhonchi and wheezing.  Patient did test positive for influenza A.  Will start patient on Tamiflu 75 mg twice daily for the next 5 days.  Patient also prescribed Promethazine DM for his cough, prednisone 20 mg to help with bronchial inflammation, and an albuterol inhaler for wheezing or shortness of breath.  Supportive care recommendations were provided and discussed with the patient to include fluids, rest, over-the-counter analgesics, and use of a humidifier at nighttime during sleep.  Discussed indications with the patient regarding follow-up.  Patient was in agreement with this plan of care and verbalizes understanding.  All questions were answered.  Patient stable for discharge.  Work note was provided.  Final Clinical Impressions(s) / UC Diagnoses   Final diagnoses:  Influenza A  Cough, unspecified type     Discharge Instructions      You have tested positive for influenza A. Take medication as prescribed. Increase fluids and allow for plenty of rest. May take over-the-counter Tylenol as needed for pain, fever, or general discomfort. Recommend the use of normal saline nasal spray throughout the day for nasal congestion and runny nose. Recommend using a humidifier in your bedroom at nighttime during sleep and sleeping elevated on pillows while cough symptoms persist. If you develop fever, you should remain home until you have been fever free for 24 hours with no medication. As discussed, if symptoms are not improving over the next 5 to 7 days, or if they suddenly  worsen, you may follow-up in this clinic or with your primary care physician for further evaluation. Follow-up as needed.     ED Prescriptions     Medication Sig Dispense Auth. Provider   albuterol (VENTOLIN HFA) 108 (90 Base) MCG/ACT inhaler Inhale 2 puffs into the lungs every 6 (six) hours as needed. 8 g Leath-Warren, Sadie Haber, NP   predniSONE (DELTASONE) 20 MG  tablet Take 1 tablet (20 mg total) by mouth daily with breakfast for 7 days. 7 tablet Leath-Warren, Sadie Haber, NP   promethazine-dextromethorphan (PROMETHAZINE-DM) 6.25-15 MG/5ML syrup Take 5 mLs by mouth 4 (four) times daily as needed. 118 mL Leath-Warren, Sadie Haber, NP   oseltamivir (TAMIFLU) 75 MG capsule Take 1 capsule (75 mg total) by mouth every 12 (twelve) hours. 10 capsule Leath-Warren, Sadie Haber, NP      PDMP not reviewed this encounter.   Abran Cantor, NP 06/17/23 1736

## 2023-06-17 NOTE — Discharge Instructions (Addendum)
You have tested positive for influenza A. Take medication as prescribed. Increase fluids and allow for plenty of rest. May take over-the-counter Tylenol as needed for pain, fever, or general discomfort. Recommend the use of normal saline nasal spray throughout the day for nasal congestion and runny nose. Recommend using a humidifier in your bedroom at nighttime during sleep and sleeping elevated on pillows while cough symptoms persist. If you develop fever, you should remain home until you have been fever free for 24 hours with no medication. As discussed, if symptoms are not improving over the next 5 to 7 days, or if they suddenly worsen, you may follow-up in this clinic or with your primary care physician for further evaluation. Follow-up as needed.

## 2023-06-30 ENCOUNTER — Ambulatory Visit (INDEPENDENT_AMBULATORY_CARE_PROVIDER_SITE_OTHER): Payer: 59 | Admitting: Family Medicine

## 2023-06-30 VITALS — BP 120/76 | HR 66 | Temp 98.7°F | Ht 69.0 in | Wt 297.6 lb

## 2023-06-30 DIAGNOSIS — Z0001 Encounter for general adult medical examination with abnormal findings: Secondary | ICD-10-CM | POA: Diagnosis not present

## 2023-06-30 DIAGNOSIS — Z122 Encounter for screening for malignant neoplasm of respiratory organs: Secondary | ICD-10-CM

## 2023-06-30 DIAGNOSIS — I2510 Atherosclerotic heart disease of native coronary artery without angina pectoris: Secondary | ICD-10-CM | POA: Diagnosis not present

## 2023-06-30 DIAGNOSIS — E118 Type 2 diabetes mellitus with unspecified complications: Secondary | ICD-10-CM

## 2023-06-30 DIAGNOSIS — Z7984 Long term (current) use of oral hypoglycemic drugs: Secondary | ICD-10-CM

## 2023-06-30 DIAGNOSIS — Z0000 Encounter for general adult medical examination without abnormal findings: Secondary | ICD-10-CM

## 2023-06-30 DIAGNOSIS — Z125 Encounter for screening for malignant neoplasm of prostate: Secondary | ICD-10-CM

## 2023-06-30 NOTE — Progress Notes (Signed)
 Subjective:    Patient ID: Manuel Carey, male    DOB: 07/20/1970, 53 y.o.   MRN: [id number]  HPI  Patient is a very pleasant 53 year-old Caucasian male here today for CPE.  Past medical history is significant for a non-ST elevation myocardial infarction in 2015.  Patient underwent cardiac catheterization at that time and had a Xience drug-eluting stent placed in the proximal LAD.  Patient had Cologuard last year.  He is due again in 2027.  He is due for prostate cancer screening.  He is also due for lung cancer screening as he continues to smoke.  He is due for the new pneumonia vaccine as well as the shingles vaccine.  He declines both of these today.  He is due for a diabetic foot exam. Past Medical History:  Diagnosis Date   Coronary artery disease    Diabetes mellitus type 2 with complications (HCC)    Environmental allergies    Heart attack (HCC)    2015, DES x 1, xience stent proximal LAD   HTN (hypertension)    Hyperlipemia    Past Surgical History:  Procedure Laterality Date   KNEE ARTHROSCOPY W/ MENISCECTOMY     left knee   LEFT HEART CATHETERIZATION WITH CORONARY ANGIOGRAM N/A 12/06/2013   Procedure: LEFT HEART CATHETERIZATION WITH CORONARY ANGIOGRAM;  Surgeon: Jessica Morn, MD;  Location: [facility] CATH LAB;  Service: Cardiovascular;  Laterality: N/A;   PERCUTANEOUS STENT INTERVENTION  12/06/2013   Procedure: PERCUTANEOUS STENT INTERVENTION;  Surgeon: Jessica Morn, MD;  Location: [facility] CATH LAB;  Service: Cardiovascular;;   Current Outpatient Medications on File Prior to Visit  Medication Sig Dispense Refill   albuterol  (VENTOLIN  HFA) 108 (90 Base) MCG/ACT inhaler Inhale 2 puffs into the lungs every 6 (six) hours as needed. 8 g 0   aspirin  81 MG chewable tablet Chew 1 tablet (81 mg total) by mouth daily.     atorvastatin  (LIPITOR ) 80 MG tablet TAKE 1 TABLET BY MOUTH ONCE  DAILY 90 tablet 3   fenofibrate  (TRICOR ) 145 MG tablet TAKE 1 TABLET BY MOUTH EVERY DAY 90 tablet 2    JARDIANCE  25 MG TABS tablet TAKE 1 TABLET BY MOUTH DAILY  BEFORE BREAKFAST 90 tablet 3   losartan  (COZAAR ) 25 MG tablet TAKE 1 TABLET BY MOUTH DAILY 90 tablet 3   metoprolol  succinate (TOPROL -XL) 25 MG 24 hr tablet TAKE 1 TABLET (25 MG TOTAL) BY MOUTH DAILY. 90 tablet 1   nitroGLYCERIN  (NITROSTAT ) 0.4 MG SL tablet Place 1 tablet (0.4 mg total) under the tongue every 5 (five) minutes as needed for up to 30 doses for chest pain. 30 tablet 0   Semaglutide , 1 MG/DOSE, 4 MG/3ML SOPN Inject 1 mg into the skin once a week. 3 mL 11   No current facility-administered medications on file prior to visit.   No Known Allergies Social History   Socioeconomic History   Marital status: Single    Spouse name: Not on file   Number of children: 0   Years of education: Not on file   Highest education level: 12th grade  Occupational History   Not on file  Tobacco Use   Smoking status: Former    Current packs/day: 0.00    Average packs/day: 0.5 packs/day for 25.0 years (12.5 ttl pk-yrs)    Types: Cigarettes    Quit date: 01/06/2023    Years since quitting: 0.4   Smokeless tobacco: Never  Vaping Use   Vaping status: Never  Used  Substance and Sexual Activity   Alcohol use: Not Currently   Drug use: Never   Sexual activity: Not on file  Other Topics Concern   Not on file  Social History Narrative   Not on file   Social Drivers of Health   Financial Resource Strain: Low Risk  (06/30/2023)   Overall Financial Resource Strain (CARDIA)    Difficulty of Paying Living Expenses: Not very hard  Food Insecurity: No Food Insecurity (06/30/2023)   Hunger Vital Sign    Worried About Running Out of Food in the Last Year: Never true    Ran Out of Food in the Last Year: Never true  Transportation Needs: No Transportation Needs (06/30/2023)   PRAPARE - Administrator, Civil Service (Medical): No    Lack of Transportation (Non-Medical): No  Physical Activity: Unknown (06/30/2023)   Exercise Vital  Sign    Days of Exercise per Week: 1 day    Minutes of Exercise per Session: Not on file  Stress: No Stress Concern Present (06/30/2023)   [employer] of Occupational Health - Occupational Stress Questionnaire    Feeling of Stress : Not at all  Social Connections: Socially Isolated (06/30/2023)   Social Connection and Isolation Panel [NHANES]    Frequency of Communication with Friends and Family: Three times a week    Frequency of Social Gatherings with Friends and Family: More than three times a week    Attends Religious Services: Never    Database administrator or Organizations: No    Attends Engineer, structural: Not on file    Marital Status: Never married  Catering manager Violence: Not on file   Family History  Problem Relation Age of Onset   Cancer Mother        breast   Diabetes Father    Hyperlipidemia Father    Hypertension Father      Review of Systems     Objective:   Physical Exam Vitals reviewed.  Constitutional:      General: He is not in acute distress.    Appearance: He is obese. He is not ill-appearing, toxic-appearing or diaphoretic.  HENT:     Head: Normocephalic and atraumatic.     Right Ear: Tympanic membrane and ear canal normal.     Left Ear: Tympanic membrane and ear canal normal.     Nose: Congestion and rhinorrhea present.     Mouth/Throat:     Mouth: Mucous membranes are moist.     Pharynx: No oropharyngeal exudate or posterior oropharyngeal erythema.  Eyes:     General: No scleral icterus.       Right eye: No discharge.        Left eye: No discharge.     Conjunctiva/sclera: Conjunctivae normal.     Pupils: Pupils are equal, round, and reactive to light.  Neck:     Vascular: No carotid bruit.  Cardiovascular:     Rate and Rhythm: Normal rate and regular rhythm.     Heart sounds: Normal heart sounds. No murmur heard.    No friction rub. No gallop.  Pulmonary:     Effort: Pulmonary effort is normal. No respiratory  distress.     Breath sounds: No stridor. Wheezing present. No rhonchi or rales.  Chest:     Chest wall: No tenderness.  Abdominal:     General: Bowel sounds are normal. There is no distension.     Palpations: Abdomen is soft. There  is no mass.     Tenderness: There is no abdominal tenderness. There is no guarding or rebound.     Hernia: No hernia is present.  Musculoskeletal:     Cervical back: Neck supple. No rigidity. No muscular tenderness.     Right lower leg: No edema.     Left lower leg: No edema.  Lymphadenopathy:     Cervical: No cervical adenopathy.  Skin:    General: Skin is warm.     Coloration: Skin is not jaundiced or pale.     Findings: No bruising, erythema, lesion or rash.  Neurological:     General: No focal deficit present.     Mental Status: He is oriented to person, place, and time.     Cranial Nerves: No cranial nerve deficit.     Sensory: No sensory deficit.     Motor: No weakness.     Coordination: Coordination normal.     Gait: Gait normal.     Deep Tendon Reflexes: Reflexes normal.  Psychiatric:        Mood and Affect: Mood normal.        Behavior: Behavior normal.        Thought Content: Thought content normal.        Judgment: Judgment normal.           Assessment & Plan:  Coronary artery disease involving native coronary artery of native heart without angina pectoris - Plan: Hemoglobin A1c, CBC with Differential/Platelet, COMPLETE METABOLIC PANEL WITH GFR, Lipid panel  Diabetes mellitus type 2 with complications (HCC) - Plan: Hemoglobin A1c, Protein / Creatinine Ratio, Urine, CBC with Differential/Platelet, COMPLETE METABOLIC PANEL WITH GFR, Lipid panel  Morbid obesity (HCC) - Plan: CBC with Differential/Platelet, COMPLETE METABOLIC PANEL WITH GFR, Lipid panel  Prostate cancer screening - Plan: PSA  Screening for lung cancer - Plan: CT CHEST LUNG CA SCREEN LOW DOSE W/O CM  General medical exam Strongly recommended smoking cessation.   Patient is currently using Ozempic  for diabetes management.  Continue to encourage him to lose weight and try to push the dose of Ozempic  higher if he can tolerate.  Blood pressure today is excellent.  Patient declines the shingles vaccine and capvaxive.  Will schedule the patient for lung cancer screening with a CAT scan.  Diabetic foot exam was performed today and is normal.  Goal LDL cholesterol is less than 55 given his history of coronary artery disease.  Screen for prostate cancer with a PSA.  Goal hemoglobin A1c is less than 6.5.

## 2023-07-01 LAB — COMPLETE METABOLIC PANEL WITHOUT GFR
AG Ratio: 2.2 (calc) (ref 1.0–2.5)
ALT: 25 U/L (ref 9–46)
AST: 16 U/L (ref 10–35)
Albumin: 4.3 g/dL (ref 3.6–5.1)
Alkaline phosphatase (APISO): 73 U/L (ref 35–144)
BUN: 13 mg/dL (ref 7–25)
CO2: 24 mmol/L (ref 20–32)
Calcium: 9.4 mg/dL (ref 8.6–10.3)
Chloride: 108 mmol/L (ref 98–110)
Creat: 0.88 mg/dL (ref 0.70–1.30)
Globulin: 2 g/dL (ref 1.9–3.7)
Glucose, Bld: 119 mg/dL — ABNORMAL HIGH (ref 65–99)
Potassium: 4 mmol/L (ref 3.5–5.3)
Sodium: 141 mmol/L (ref 135–146)
Total Bilirubin: 0.3 mg/dL (ref 0.2–1.2)
Total Protein: 6.3 g/dL (ref 6.1–8.1)
eGFR: 103 mL/min/1.73m2

## 2023-07-01 LAB — HEMOGLOBIN A1C
Hgb A1c MFr Bld: 6.5 %{Hb} — ABNORMAL HIGH
Mean Plasma Glucose: 140 mg/dL
eAG (mmol/L): 7.7 mmol/L

## 2023-07-01 LAB — CBC WITH DIFFERENTIAL/PLATELET
Absolute Lymphocytes: 2994 {cells}/uL (ref 850–3900)
Absolute Monocytes: 537 {cells}/uL (ref 200–950)
Basophils Absolute: 82 {cells}/uL (ref 0–200)
Basophils Relative: 0.9 %
Eosinophils Absolute: 91 {cells}/uL (ref 15–500)
Eosinophils Relative: 1 %
HCT: 43.7 % (ref 38.5–50.0)
Hemoglobin: 14.7 g/dL (ref 13.2–17.1)
MCH: 31.2 pg (ref 27.0–33.0)
MCHC: 33.6 g/dL (ref 32.0–36.0)
MCV: 92.8 fL (ref 80.0–100.0)
MPV: 8.9 fL (ref 7.5–12.5)
Monocytes Relative: 5.9 %
Neutro Abs: 5396 {cells}/uL (ref 1500–7800)
Neutrophils Relative %: 59.3 %
Platelets: 275 10*3/uL (ref 140–400)
RBC: 4.71 10*6/uL (ref 4.20–5.80)
RDW: 12.6 % (ref 11.0–15.0)
Total Lymphocyte: 32.9 %
WBC: 9.1 10*3/uL (ref 3.8–10.8)

## 2023-07-01 LAB — LIPID PANEL
Cholesterol: 100 mg/dL
HDL: 31 mg/dL — ABNORMAL LOW
LDL Cholesterol (Calc): 48 mg/dL
Non-HDL Cholesterol (Calc): 69 mg/dL
Total CHOL/HDL Ratio: 3.2 (calc)
Triglycerides: 124 mg/dL

## 2023-07-01 LAB — PROTEIN / CREATININE RATIO, URINE
Creatinine, Urine: 66 mg/dL (ref 20–320)
Total Protein, Urine: <4 mg/dL — ABNORMAL LOW (ref 5–25)

## 2023-07-01 LAB — PSA: PSA: 0.55 ng/mL

## 2023-07-08 ENCOUNTER — Other Ambulatory Visit: Payer: Self-pay | Admitting: Cardiology

## 2023-07-08 DIAGNOSIS — I2510 Atherosclerotic heart disease of native coronary artery without angina pectoris: Secondary | ICD-10-CM

## 2023-07-15 ENCOUNTER — Other Ambulatory Visit: Payer: Self-pay | Admitting: Family Medicine

## 2023-07-28 ENCOUNTER — Ambulatory Visit
Admission: RE | Admit: 2023-07-28 | Discharge: 2023-07-28 | Disposition: A | Discharge status: Home / Self Care | Payer: 59 | Source: Ambulatory Visit | Attending: Family Medicine | Admitting: Family Medicine

## 2023-07-28 DIAGNOSIS — Z122 Encounter for screening for malignant neoplasm of respiratory organs: Secondary | ICD-10-CM

## 2023-09-05 DIAGNOSIS — M76821 Posterior tibial tendinitis, right leg: Secondary | ICD-10-CM | POA: Insufficient documentation

## 2023-09-15 ENCOUNTER — Other Ambulatory Visit: Payer: Self-pay | Admitting: Cardiology

## 2023-09-15 DIAGNOSIS — I2510 Atherosclerotic heart disease of native coronary artery without angina pectoris: Secondary | ICD-10-CM

## 2023-09-21 ENCOUNTER — Other Ambulatory Visit: Payer: Self-pay | Admitting: Cardiology

## 2023-09-21 DIAGNOSIS — I2510 Atherosclerotic heart disease of native coronary artery without angina pectoris: Secondary | ICD-10-CM

## 2023-10-01 ENCOUNTER — Other Ambulatory Visit: Payer: Self-pay | Admitting: Cardiology

## 2023-10-10 DIAGNOSIS — M79671 Pain in right foot: Secondary | ICD-10-CM | POA: Insufficient documentation

## 2023-11-12 ENCOUNTER — Other Ambulatory Visit: Payer: Self-pay | Admitting: Orthopedic Surgery

## 2023-11-12 DIAGNOSIS — M25571 Pain in right ankle and joints of right foot: Secondary | ICD-10-CM

## 2023-11-24 ENCOUNTER — Ambulatory Visit
Admission: RE | Admit: 2023-11-24 | Discharge: 2023-11-24 | Disposition: A | Discharge status: Home / Self Care | Payer: Worker's Compensation | Source: Ambulatory Visit | Attending: Orthopedic Surgery | Admitting: Orthopedic Surgery

## 2023-11-24 DIAGNOSIS — M25571 Pain in right ankle and joints of right foot: Secondary | ICD-10-CM

## 2023-12-23 ENCOUNTER — Other Ambulatory Visit: Payer: Self-pay | Admitting: Cardiology

## 2023-12-29 ENCOUNTER — Other Ambulatory Visit: Payer: Self-pay | Admitting: Cardiology

## 2023-12-29 DIAGNOSIS — I2510 Atherosclerotic heart disease of native coronary artery without angina pectoris: Secondary | ICD-10-CM

## 2024-02-09 ENCOUNTER — Other Ambulatory Visit: Payer: Self-pay | Admitting: Cardiology

## 2024-02-12 ENCOUNTER — Other Ambulatory Visit: Payer: Self-pay

## 2024-02-12 MED ORDER — FENOFIBRATE 145 MG PO TABS: 145.0000 mg | ORAL_TABLET | Freq: Every day | ORAL | 0 refills | Status: DC

## 2024-02-13 DIAGNOSIS — M19071 Primary osteoarthritis, right ankle and foot: Secondary | ICD-10-CM | POA: Insufficient documentation

## 2024-02-18 ENCOUNTER — Telehealth: Payer: Self-pay | Admitting: Cardiology

## 2024-02-18 DIAGNOSIS — I2510 Atherosclerotic heart disease of native coronary artery without angina pectoris: Secondary | ICD-10-CM

## 2024-02-18 MED ORDER — METOPROLOL SUCCINATE ER 25 MG PO TB24: 25.0000 mg | ORAL_TABLET | Freq: Every day | ORAL | 0 refills | Status: DC

## 2024-02-18 NOTE — Telephone Encounter (Signed)
*  STAT* If patient is at the pharmacy, call can be transferred to refill team.   1. Which medications need to be refilled? (please list name of each medication and dose if known) metoprolol  succinate (TOPROL -XL) 25 MG 24 hr tablet    2. Would you like to learn more about the convenience, safety, & potential cost savings by using the [facility]? No   3. Are you open to using the [facility] ([facility] No   4. Which pharmacy/location (including street and city if local pharmacy) is medication to be sent to? [facility] - [place], [place] - [address]    5. Do they need a 30 day or 90 day supply? 90 day  Pt took last pill this morning

## 2024-02-18 NOTE — Telephone Encounter (Signed)
 Pt's medication was sent to pt's pharmacy as requested. Confirmation received.

## 2024-02-19 ENCOUNTER — Telehealth: Payer: Self-pay | Admitting: Cardiology

## 2024-02-19 DIAGNOSIS — I2510 Atherosclerotic heart disease of native coronary artery without angina pectoris: Secondary | ICD-10-CM

## 2024-02-19 MED ORDER — METOPROLOL SUCCINATE ER 25 MG PO TB24: 25.0000 mg | ORAL_TABLET | Freq: Every day | ORAL | 0 refills | Status: DC

## 2024-02-19 NOTE — Telephone Encounter (Signed)
 RX sent in

## 2024-02-19 NOTE — Telephone Encounter (Signed)
*  STAT* If patient is at the pharmacy, call can be transferred to refill team.   1. Which medications need to be refilled? (please list name of each medication and dose if known) metoprolol  succinate (TOPROL -XL) 25 MG 24 hr tablet    2. Would you like to learn more about the convenience, safety, & potential cost savings by using the [facility]? No    3. Are you open to using the [facility] ([facility]. No    4. Which pharmacy/location (including street and city if local pharmacy) is medication to be sent to?[id number] [place], [place] - [address]    5. Do they need a 30 day or 90 day supply? 30 day    Pt is out of medication.  Originally sent to [facility] but they could not deliver it in time.

## 2024-02-23 ENCOUNTER — Ambulatory Visit: Admitting: Family Medicine

## 2024-02-23 VITALS — BP 118/64 | HR 53 | Temp 98.2°F | Ht 69.0 in | Wt 312.0 lb

## 2024-02-23 DIAGNOSIS — Z7984 Long term (current) use of oral hypoglycemic drugs: Secondary | ICD-10-CM

## 2024-02-23 DIAGNOSIS — I2510 Atherosclerotic heart disease of native coronary artery without angina pectoris: Secondary | ICD-10-CM | POA: Diagnosis not present

## 2024-02-23 DIAGNOSIS — E118 Type 2 diabetes mellitus with unspecified complications: Secondary | ICD-10-CM | POA: Diagnosis not present

## 2024-02-23 NOTE — Progress Notes (Signed)
 Subjective:    Patient ID: Manuel Carey, male    DOB: January 09, 1971, 53 y.o.   MRN: [id number]  HPI  Patient is a very pleasant 53 year old Caucasian woman here today for diabetes.  Has not hemoglobin A1c was 6.5 in February.  He originally tried Jardiance  due to his history of coronary artery disease however he had to stop the medication due to recurrent yeast infections.  We then tried the patient on Ozempic  to facilitate weight loss.  He states that he has severe constipation.  He was taking MiraLAX on a daily basis and despite doing this he was still dealing with constipation so severe he was having to use magnesium citrate.  He stopped all medications for his diabetes for the last 3 months.  Since doing that he has not had any yeast infections or constipation.  Therefore he would like to find an alternative. Past Medical History:  Diagnosis Date   Coronary artery disease    Diabetes mellitus type 2 with complications (HCC)    Environmental allergies    Heart attack (HCC)    2015, DES x 1, xience stent proximal LAD   HTN (hypertension)    Hyperlipemia    Past Surgical History:  Procedure Laterality Date   KNEE ARTHROSCOPY W/ MENISCECTOMY     left knee   LEFT HEART CATHETERIZATION WITH CORONARY ANGIOGRAM N/A 12/06/2013   Procedure: LEFT HEART CATHETERIZATION WITH CORONARY ANGIOGRAM;  Surgeon: Erick JONELLE Bergamo, MD;  Location: [facility] CATH LAB;  Service: Cardiovascular;  Laterality: N/A;   PERCUTANEOUS STENT INTERVENTION  12/06/2013   Procedure: PERCUTANEOUS STENT INTERVENTION;  Surgeon: Erick JONELLE Bergamo, MD;  Location: [facility] CATH LAB;  Service: Cardiovascular;;   Current Outpatient Medications on File Prior to Visit  Medication Sig Dispense Refill   albuterol  (VENTOLIN  HFA) 108 (90 Base) MCG/ACT inhaler Inhale 2 puffs into the lungs every 6 (six) hours as needed. 8 g 0   aspirin  81 MG chewable tablet Chew 1 tablet (81 mg total) by mouth daily.     atorvastatin  (LIPITOR ) 80 MG tablet TAKE 1  TABLET BY MOUTH ONCE  DAILY 90 tablet 0   fenofibrate  (TRICOR ) 145 MG tablet Take 1 tablet (145 mg total) by mouth daily. 30 tablet 0   losartan  (COZAAR ) 25 MG tablet TAKE 1 TABLET BY MOUTH DAILY 90 tablet 0   metoprolol  succinate (TOPROL -XL) 25 MG 24 hr tablet Take 1 tablet (25 mg total) by mouth daily. 90 tablet 0   nitroGLYCERIN  (NITROSTAT ) 0.4 MG SL tablet PLACE 1 TABLET UNDER THE TONGUE EVERY 5 MINUTES AS NEEDED FOR CHEST PAIN. 25 tablet 3   No current facility-administered medications on file prior to visit.   No Known Allergies Social History   Socioeconomic History   Marital status: Single    Spouse name: Not on file   Number of children: 0   Years of education: Not on file   Highest education level: 12th grade  Occupational History   Not on file  Tobacco Use   Smoking status: Former    Current packs/day: 0.00    Average packs/day: 0.5 packs/day for 25.0 years (12.5 ttl pk-yrs)    Types: Cigarettes    Quit date: 01/06/2023    Years since quitting: 1.1   Smokeless tobacco: Never  Vaping Use   Vaping status: Never Used  Substance and Sexual Activity   Alcohol use: Not Currently   Drug use: Never   Sexual activity: Not on file  Other Topics Concern  Not on file  Social History Narrative   Not on file   Social Drivers of Health   Financial Resource Strain: Low Risk  (02/23/2024)   Overall Financial Resource Strain (CARDIA)    Difficulty of Paying Living Expenses: Not hard at all  Food Insecurity: No Food Insecurity (02/23/2024)   Hunger Vital Sign    Worried About Running Out of Food in the Last Year: Never true    Ran Out of Food in the Last Year: Never true  Transportation Needs: No Transportation Needs (02/23/2024)   PRAPARE - Administrator, Civil Service (Medical): No    Lack of Transportation (Non-Medical): No  Physical Activity: Insufficiently Active (02/23/2024)   Exercise Vital Sign    Days of Exercise per Week: 2 days    Minutes of Exercise  per Session: 20 min  Stress: No Stress Concern Present (02/23/2024)   [employer] of Occupational Health - Occupational Stress Questionnaire    Feeling of Stress: Only a little  Social Connections: Socially Isolated (02/23/2024)   Social Connection and Isolation Panel    Frequency of Communication with Friends and Family: More than three times a week    Frequency of Social Gatherings with Friends and Family: More than three times a week    Attends Religious Services: Never    Database administrator or Organizations: No    Attends Engineer, structural: Not on file    Marital Status: Never married  Catering manager Violence: Not on file   Family History  Problem Relation Age of Onset   Cancer Mother        breast   Diabetes Father    Hyperlipidemia Father    Hypertension Father      Review of Systems     Objective:   Physical Exam Vitals reviewed.  Constitutional:      General: He is not in acute distress.    Appearance: He is obese. He is not ill-appearing, toxic-appearing or diaphoretic.  HENT:     Head: Normocephalic and atraumatic.     Right Ear: Tympanic membrane and ear canal normal.     Left Ear: Tympanic membrane and ear canal normal.     Nose: Congestion and rhinorrhea present.     Mouth/Throat:     Mouth: Mucous membranes are moist.     Pharynx: No oropharyngeal exudate or posterior oropharyngeal erythema.  Eyes:     General: No scleral icterus.       Right eye: No discharge.        Left eye: No discharge.     Conjunctiva/sclera: Conjunctivae normal.     Pupils: Pupils are equal, round, and reactive to light.  Neck:     Vascular: No carotid bruit.  Cardiovascular:     Rate and Rhythm: Normal rate and regular rhythm.     Heart sounds: Normal heart sounds. No murmur heard.    No friction rub. No gallop.  Pulmonary:     Effort: Pulmonary effort is normal. No respiratory distress.     Breath sounds: No stridor. Wheezing present. No rhonchi or  rales.  Chest:     Chest wall: No tenderness.  Abdominal:     General: Bowel sounds are normal. There is no distension.     Palpations: Abdomen is soft. There is no mass.     Tenderness: There is no abdominal tenderness. There is no guarding or rebound.     Hernia: No hernia is present.  Musculoskeletal:     Cervical back: Neck supple. No rigidity. No muscular tenderness.     Right lower leg: No edema.     Left lower leg: No edema.  Lymphadenopathy:     Cervical: No cervical adenopathy.  Skin:    General: Skin is warm.     Coloration: Skin is not jaundiced or pale.     Findings: No bruising, erythema, lesion or rash.  Neurological:     General: No focal deficit present.     Mental Status: He is oriented to person, place, and time.     Cranial Nerves: No cranial nerve deficit.     Sensory: No sensory deficit.     Motor: No weakness.     Coordination: Coordination normal.     Gait: Gait normal.     Deep Tendon Reflexes: Reflexes normal.  Psychiatric:        Mood and Affect: Mood normal.        Behavior: Behavior normal.        Thought Content: Thought content normal.        Judgment: Judgment normal.           Assessment & Plan:  Diabetes mellitus type 2 with complications (HCC) - Plan: CBC with Differential/Platelet, Comprehensive metabolic panel with GFR, Lipid panel, Hemoglobin A1c, Microalbumin/Creatinine Ratio, Urine  Coronary artery disease involving native coronary artery of native heart without angina pectoris with We will check his hemoglobin A1c today.  If greater than 7 we discussed alternative medications and the patient has elected to try metformin .  We also discussed trying Mounjaro for the weight loss however given its similarity to Ozempic  he prefers metformin .  We would like to avoid glipizide  and pioglitazone due to potential weight gain.

## 2024-02-24 ENCOUNTER — Ambulatory Visit: Payer: Self-pay | Admitting: Family Medicine

## 2024-02-24 LAB — COMPREHENSIVE METABOLIC PANEL WITH GFR
AG Ratio: 2.2 (calc) (ref 1.0–2.5)
ALT: 30 U/L (ref 9–46)
AST: 19 U/L (ref 10–35)
Albumin: 4.2 g/dL (ref 3.6–5.1)
Alkaline phosphatase (APISO): 66 U/L (ref 35–144)
BUN: 13 mg/dL (ref 7–25)
CO2: 26 mmol/L (ref 20–32)
Calcium: 9 mg/dL (ref 8.6–10.3)
Chloride: 106 mmol/L (ref 98–110)
Creat: 0.93 mg/dL (ref 0.70–1.30)
Globulin: 1.9 g/dL (ref 1.9–3.7)
Glucose, Bld: 140 mg/dL — ABNORMAL HIGH (ref 65–99)
Potassium: 4.3 mmol/L (ref 3.5–5.3)
Sodium: 139 mmol/L (ref 135–146)
Total Bilirubin: 0.5 mg/dL (ref 0.2–1.2)
Total Protein: 6.1 g/dL (ref 6.1–8.1)
eGFR: 98 mL/min/1.73m2 (ref >=60)

## 2024-02-24 LAB — CBC WITH DIFFERENTIAL/PLATELET
Absolute Lymphocytes: 2967 {cells}/uL (ref 850–3900)
Absolute Monocytes: 461 {cells}/uL (ref 200–950)
Basophils Absolute: 61 {cells}/uL (ref 0–200)
Basophils Relative: 0.7 %
Eosinophils Absolute: 96 {cells}/uL (ref 15–500)
Eosinophils Relative: 1.1 %
HCT: 43.2 % (ref 38.5–50.0)
Hemoglobin: 14.3 g/dL (ref 13.2–17.1)
MCH: 31.7 pg (ref 27.0–33.0)
MCHC: 33.1 g/dL (ref 32.0–36.0)
MCV: 95.8 fL (ref 80.0–100.0)
MPV: 9.1 fL (ref 7.5–12.5)
Monocytes Relative: 5.3 %
Neutro Abs: 5116 {cells}/uL (ref 1500–7800)
Neutrophils Relative %: 58.8 %
Platelets: 236 Thousand/uL (ref 140–400)
RBC: 4.51 Million/uL (ref 4.20–5.80)
RDW: 12.8 % (ref 11.0–15.0)
Total Lymphocyte: 34.1 %
WBC: 8.7 Thousand/uL (ref 3.8–10.8)

## 2024-02-24 LAB — LIPID PANEL
Cholesterol: 101 mg/dL (ref <200)
HDL: 29 mg/dL — ABNORMAL LOW (ref >=40)
LDL Cholesterol (Calc): 54 mg/dL
Non-HDL Cholesterol (Calc): 72 mg/dL (ref <130)
Total CHOL/HDL Ratio: 3.5 (calc) (ref <5.0)
Triglycerides: 97 mg/dL (ref <150)

## 2024-02-24 LAB — HEMOGLOBIN A1C
Hgb A1c MFr Bld: 7 % — ABNORMAL HIGH (ref <5.7)
Mean Plasma Glucose: 154 mg/dL
eAG (mmol/L): 8.5 mmol/L

## 2024-02-24 LAB — MICROALBUMIN / CREATININE URINE RATIO
Creatinine, Urine: 56 mg/dL (ref 20–320)
Microalb Creat Ratio: 4 mg/g{creat} (ref <30)
Microalb, Ur: 0.2 mg/dL

## 2024-02-25 ENCOUNTER — Other Ambulatory Visit: Payer: Self-pay

## 2024-02-25 DIAGNOSIS — E118 Type 2 diabetes mellitus with unspecified complications: Secondary | ICD-10-CM

## 2024-02-25 DIAGNOSIS — I2510 Atherosclerotic heart disease of native coronary artery without angina pectoris: Secondary | ICD-10-CM

## 2024-02-25 MED ORDER — METFORMIN HCL 500 MG PO TABS: 500.0000 mg | ORAL_TABLET | Freq: Two times a day (BID) | ORAL | 3 refills | Status: AC

## 2024-02-26 ENCOUNTER — Other Ambulatory Visit: Payer: Self-pay | Admitting: Cardiology

## 2024-05-03 ENCOUNTER — Other Ambulatory Visit: Payer: Self-pay | Admitting: Cardiology

## 2024-05-07 ENCOUNTER — Ambulatory Visit: Admitting: Family Medicine

## 2024-05-07 VITALS — BP 146/76 | HR 57 | Temp 98.4°F | Ht 69.0 in | Wt 318.6 lb

## 2024-05-07 DIAGNOSIS — J441 Chronic obstructive pulmonary disease with (acute) exacerbation: Secondary | ICD-10-CM | POA: Diagnosis not present

## 2024-05-07 MED ORDER — AZITHROMYCIN 250 MG PO TABS: ORAL_TABLET | ORAL | 0 refills | Status: AC

## 2024-05-07 MED ORDER — PREDNISONE 20 MG PO TABS: ORAL_TABLET | ORAL | 0 refills | Status: AC

## 2024-05-07 MED ORDER — TIRZEPATIDE 7.5 MG/0.5ML ~~LOC~~ SOAJ: 7.5000 mg | SUBCUTANEOUS | 1 refills | Status: AC

## 2024-05-07 NOTE — Progress Notes (Signed)
 "  Subjective:    Patient ID: Manuel Carey, male    DOB: [birth year], 53 y.o.   MRN: [id number]  Cough Associated symptoms include rhinorrhea.   Patient presents with 4 days of sinus pain and pressure.  He reports head congestion.  He denies any body aches.  He also reports shortness of breath.  He reports wheezing.  He reports a dry cough.  He states that he is not able to cough up any chest congestion due to the wheezing.  He denies any purulent sputum.  He denies any purulent nasal drainage.  He denies any fevers or chills. Past Medical History:  Diagnosis Date   Coronary artery disease    Diabetes mellitus type 2 with complications (HCC)    Environmental allergies    Heart attack (HCC)    2015, DES x 1, xience stent proximal LAD   HTN (hypertension)    Hyperlipemia    Past Surgical History:  Procedure Laterality Date   KNEE ARTHROSCOPY W/ MENISCECTOMY     left knee   LEFT HEART CATHETERIZATION WITH CORONARY ANGIOGRAM N/A 12/06/2013   Procedure: LEFT HEART CATHETERIZATION WITH CORONARY ANGIOGRAM;  Surgeon: Erick JONELLE Bergamo, MD;  Location: [facility] CATH LAB;  Service: Cardiovascular;  Laterality: N/A;   PERCUTANEOUS STENT INTERVENTION  12/06/2013   Procedure: PERCUTANEOUS STENT INTERVENTION;  Surgeon: Erick JONELLE Bergamo, MD;  Location: [facility] CATH LAB;  Service: Cardiovascular;;   Current Outpatient Medications on File Prior to Visit  Medication Sig Dispense Refill   albuterol  (VENTOLIN  HFA) 108 (90 Base) MCG/ACT inhaler Inhale 2 puffs into the lungs every 6 (six) hours as needed. 8 g 0   aspirin  81 MG chewable tablet Chew 1 tablet (81 mg total) by mouth daily.     atorvastatin  (LIPITOR ) 80 MG tablet TAKE 1 TABLET BY MOUTH ONCE  DAILY 90 tablet 0   fenofibrate  (TRICOR ) 145 MG tablet TAKE 1 TABLET BY MOUTH DAILY 15 tablet 0   losartan  (COZAAR ) 25 MG tablet TAKE 1 TABLET BY MOUTH DAILY 90 tablet 0   metoprolol  succinate (TOPROL -XL) 25 MG 24 hr tablet Take 1 tablet (25 mg total) by mouth daily.  90 tablet 0   nitroGLYCERIN  (NITROSTAT ) 0.4 MG SL tablet PLACE 1 TABLET UNDER THE TONGUE EVERY 5 MINUTES AS NEEDED FOR CHEST PAIN. 25 tablet 3   metFORMIN  (GLUCOPHAGE ) 500 MG tablet Take 1 tablet (500 mg total) by mouth 2 (two) times daily with a meal. (Patient not taking: Reported on 05/07/2024) 180 tablet 3   No current facility-administered medications on file prior to visit.   No Known Allergies Social History   Socioeconomic History   Marital status: Single    Spouse name: Not on file   Number of children: 0   Years of education: Not on file   Highest education level: 12th grade  Occupational History   Not on file  Tobacco Use   Smoking status: Former    Current packs/day: 0.00    Average packs/day: 0.5 packs/day for 25.0 years (12.5 ttl pk-yrs)    Types: Cigarettes    Quit date: 01/06/2023    Years since quitting: 1.3   Smokeless tobacco: Never  Vaping Use   Vaping status: Never Used  Substance and Sexual Activity   Alcohol use: Not Currently   Drug use: Never   Sexual activity: Not on file  Other Topics Concern   Not on file  Social History Narrative   Not on file   Social Drivers of Health  Tobacco Use: Medium Risk (02/23/2024)   Patient History    Smoking Tobacco Use: Former    Smokeless Tobacco Use: Never    Passive Exposure: Not on file  Financial Resource Strain: Low Risk (02/23/2024)   Overall Financial Resource Strain (CARDIA)    Difficulty of Paying Living Expenses: Not hard at all  Food Insecurity: No Food Insecurity (02/23/2024)   Epic    Worried About Programme Researcher, Broadcasting/film/video in the Last Year: Never true    Ran Out of Food in the Last Year: Never true  Transportation Needs: No Transportation Needs (05/07/2024)   Epic    Lack of Transportation (Medical): No    Lack of Transportation (Non-Medical): No  Physical Activity: Insufficiently Active (02/23/2024)   Exercise Vital Sign    Days of Exercise per Week: 2 days    Minutes of Exercise per Session: 20  min  Stress: No Stress Concern Present (02/23/2024)   [employer] of Occupational Health - Occupational Stress Questionnaire    Feeling of Stress: Only a little  Social Connections: Unknown (05/07/2024)   Social Connection and Isolation Panel    Frequency of Communication with Friends and Family: Once a week    Frequency of Social Gatherings with Friends and Family: Not on file    Attends Religious Services: Not on file    Active Member of Clubs or Organizations: No    Attends Banker Meetings: Not on file    Marital Status: Not on file  Recent Concern: Social Connections - Socially Isolated (02/23/2024)   Social Connection and Isolation Panel    Frequency of Communication with Friends and Family: More than three times a week    Frequency of Social Gatherings with Friends and Family: More than three times a week    Attends Religious Services: Never    Database Administrator or Organizations: No    Attends Engineer, Structural: Not on file    Marital Status: Never married  Intimate Partner Violence: Not on file  Depression (PHQ2-9): Low Risk (05/07/2024)   Depression (PHQ2-9)    PHQ-2 Score: 0  Alcohol Screen: Low Risk (02/23/2024)   Alcohol Screen    Last Alcohol Screening Score (AUDIT): 1  Housing: Unknown (02/23/2024)   Epic    Unable to Pay for Housing in the Last Year: No    Number of Times Moved in the Last Year: Not on file    Homeless in the Last Year: No  Utilities: Not on file  Health Literacy: Not on file      Review of Systems  HENT:  Positive for congestion, rhinorrhea, sinus pressure and sinus pain.   All other systems reviewed and are negative.      Objective:   Physical Exam Vitals reviewed.  Constitutional:      Appearance: He is obese. He is not ill-appearing or toxic-appearing.  HENT:     Right Ear: Tympanic membrane and ear canal normal.     Left Ear: Tympanic membrane and ear canal normal.     Nose: Congestion and  rhinorrhea present.     Mouth/Throat:     Pharynx: No oropharyngeal exudate or posterior oropharyngeal erythema.  Eyes:     Conjunctiva/sclera: Conjunctivae normal.     Pupils: Pupils are equal, round, and reactive to light.  Cardiovascular:     Rate and Rhythm: Normal rate and regular rhythm.     Heart sounds: Normal heart sounds.  Pulmonary:     Effort:  Pulmonary effort is normal. No respiratory distress.     Breath sounds: Decreased air movement present. Decreased breath sounds and wheezing present. No rhonchi or rales.  Abdominal:     General: Abdomen is flat. Bowel sounds are normal.     Palpations: Abdomen is soft.     Tenderness: There is no abdominal tenderness. There is no guarding or rebound.  Neurological:     Mental Status: He is alert.           Assessment & Plan:  COPD exacerbation (HCC) I believe the patient has a viral upper respiratory infection that has triggered a COPD exacerbation.  I recommended that he take a prednisone  taper pack to help with the bronchospasms and shortness of breath.  I believe that this is brought on by his smoking so I continue to encourage smoking cessation.  I recommended he use albuterol  2 puffs every 6 hours.  I see no evidence of a serious bacterial infection.  I gave the patient a prescription for a Z-Pak with strict instructions not to fill unless he develops purulent sputum, high fever, or severe sinus pain.  Patient understands and agrees with the plan "

## 2024-05-14 ENCOUNTER — Other Ambulatory Visit: Payer: Self-pay | Admitting: Cardiology

## 2024-05-14 DIAGNOSIS — I2510 Atherosclerotic heart disease of native coronary artery without angina pectoris: Secondary | ICD-10-CM

## 2024-05-25 ENCOUNTER — Other Ambulatory Visit: Payer: Self-pay | Admitting: Cardiology

## 2024-06-02 ENCOUNTER — Other Ambulatory Visit: Payer: Self-pay | Admitting: Cardiology

## 2024-06-03 NOTE — Telephone Encounter (Signed)
 Lipids not in Normal Range  In accordance with refill protocols, please review and address the following requirements before this medication refill can be authorized:  Labs

## 2024-06-03 NOTE — Telephone Encounter (Signed)
 I have not seen the patient since 2024. For patient's safety he needs to have surveillance labs including lipids and hepatic panel or CMP. Please arrange labs prior to next office visit.   In the meantime see if PCP can refill his medications.  Dr. Toccara Alford

## 2024-06-24 ENCOUNTER — Other Ambulatory Visit: Payer: Self-pay | Admitting: Cardiology
